# Patient Record
Sex: Female | Born: 1970
Health system: Southern US, Community
[De-identification: ages and names within clinical notes are randomized; demographics above are authoritative.]

## PROBLEM LIST (undated history)

## (undated) DIAGNOSIS — E119 Type 2 diabetes mellitus without complications: Secondary | ICD-10-CM

## (undated) DIAGNOSIS — K219 Gastro-esophageal reflux disease without esophagitis: Secondary | ICD-10-CM

## (undated) DIAGNOSIS — E785 Hyperlipidemia, unspecified: Secondary | ICD-10-CM

## (undated) DIAGNOSIS — E282 Polycystic ovarian syndrome: Secondary | ICD-10-CM

## (undated) DIAGNOSIS — K760 Fatty (change of) liver, not elsewhere classified: Secondary | ICD-10-CM

## (undated) DIAGNOSIS — Z8619 Personal history of other infectious and parasitic diseases: Secondary | ICD-10-CM

## (undated) HISTORY — DX: Type 2 diabetes mellitus without complications: E11.9

## (undated) HISTORY — DX: Hyperlipidemia, unspecified: E78.5

## (undated) HISTORY — DX: Fatty (change of) liver, not elsewhere classified: K76.0

## (undated) HISTORY — DX: Polycystic ovarian syndrome: E28.2

## (undated) HISTORY — DX: Gastro-esophageal reflux disease without esophagitis: K21.9

## (undated) HISTORY — PX: NOVASURE ABLATION: SHX5394

## (undated) HISTORY — DX: Personal history of other infectious and parasitic diseases: Z86.19

---

## 2003-06-16 DIAGNOSIS — N946 Dysmenorrhea, unspecified: Secondary | ICD-10-CM | POA: Insufficient documentation

## 2005-05-09 ENCOUNTER — Emergency Department: Payer: Self-pay | Admitting: Emergency Medicine

## 2005-08-25 DIAGNOSIS — E785 Hyperlipidemia, unspecified: Secondary | ICD-10-CM | POA: Insufficient documentation

## 2005-08-25 DIAGNOSIS — E039 Hypothyroidism, unspecified: Secondary | ICD-10-CM | POA: Insufficient documentation

## 2006-09-17 DIAGNOSIS — F32A Depression, unspecified: Secondary | ICD-10-CM | POA: Insufficient documentation

## 2009-06-21 ENCOUNTER — Emergency Department: Payer: Self-pay | Admitting: Emergency Medicine

## 2010-07-28 HISTORY — PX: OTHER SURGICAL HISTORY: SHX169

## 2010-09-01 ENCOUNTER — Ambulatory Visit: Payer: Self-pay | Admitting: Obstetrics & Gynecology

## 2010-09-04 ENCOUNTER — Ambulatory Visit: Payer: Self-pay | Admitting: Obstetrics & Gynecology

## 2010-09-04 HISTORY — PX: TUBAL LIGATION: SHX77

## 2010-09-08 LAB — PATHOLOGY REPORT

## 2010-12-26 ENCOUNTER — Emergency Department: Payer: Self-pay | Admitting: Emergency Medicine

## 2011-05-26 ENCOUNTER — Ambulatory Visit: Payer: Self-pay | Admitting: Family Medicine

## 2012-07-02 IMAGING — CR DG CHEST 2V
1 series · 2 of 2 positions shown · non-contrast
Comparison: none

REASON FOR EXAM: cough
COMMENTS:

PROCEDURE:     KDR - KDXR CHEST PA (OR AP) AND LAT  - May 26, 2011  [DATE]
RESULT:     The lungs are clear. The cardiac silhouette and visualized bony
skeleton are unremarkable.

[Series 1: pa · 0.17mm/px · 2 of 2 slices shown]
[im 1/2]
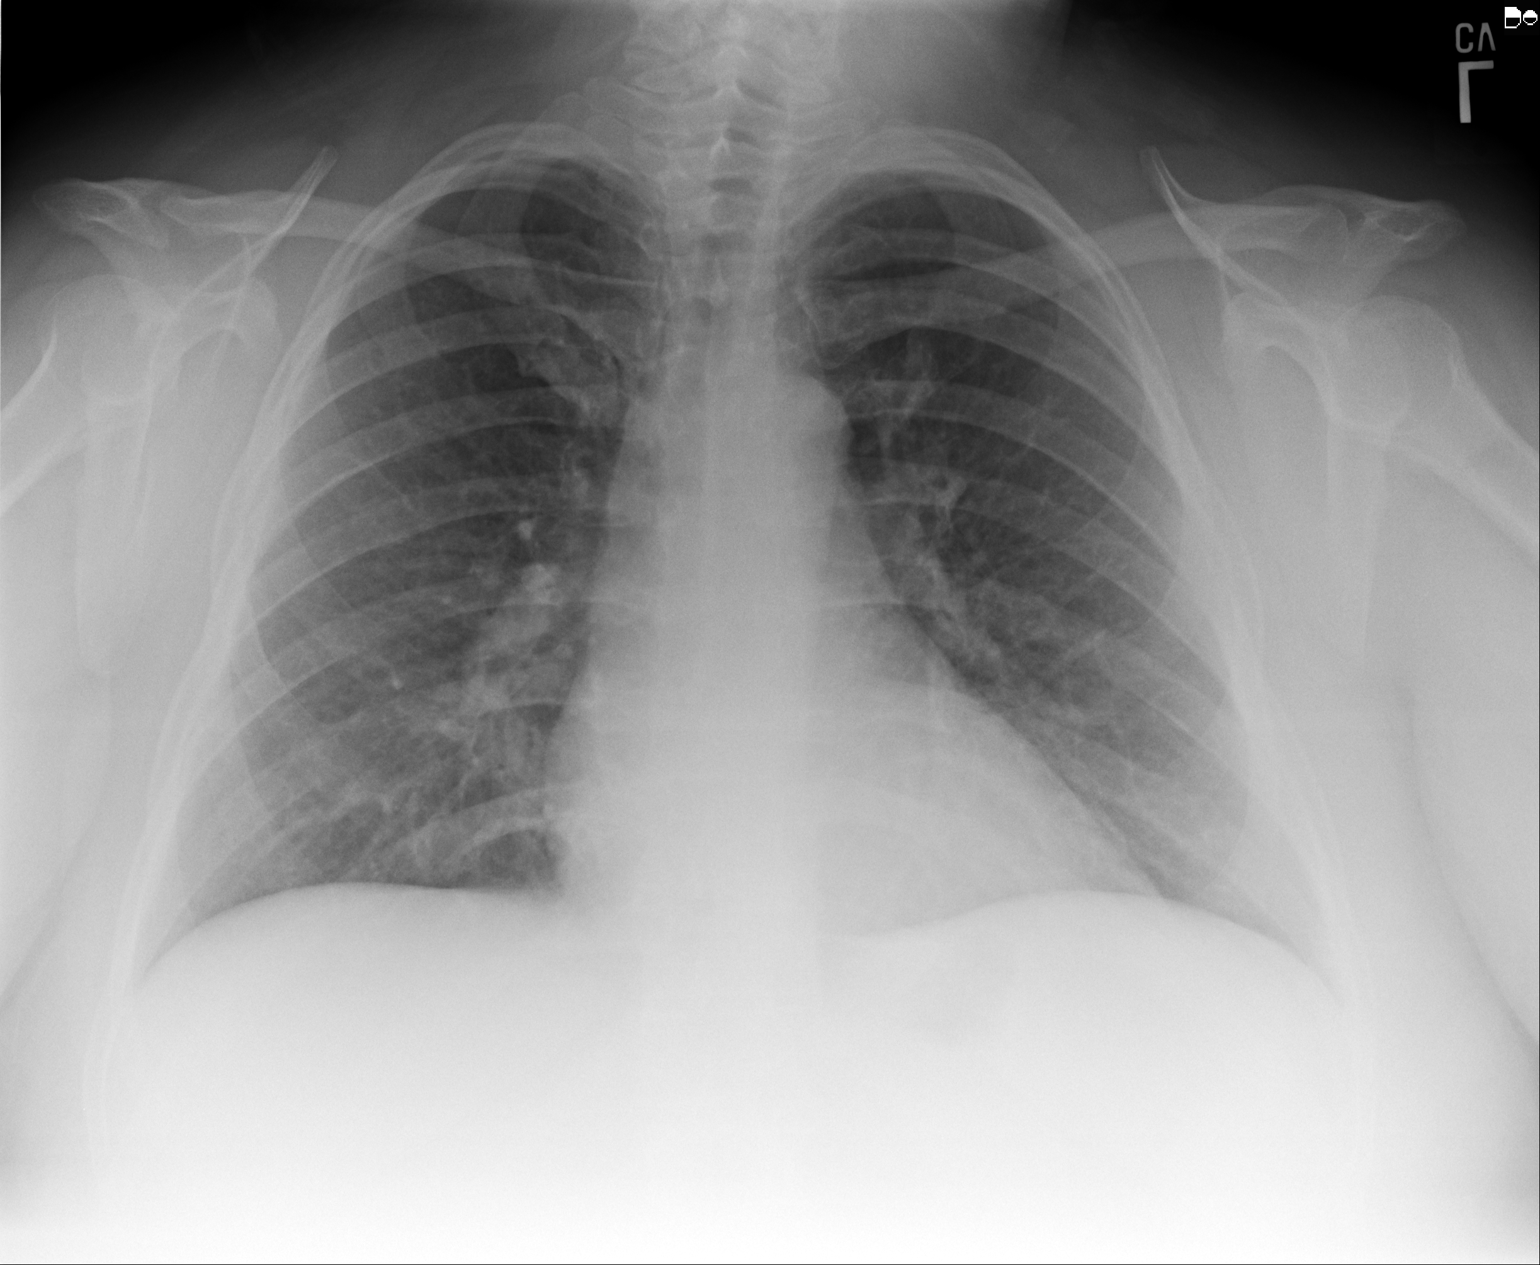
[im 2/2]
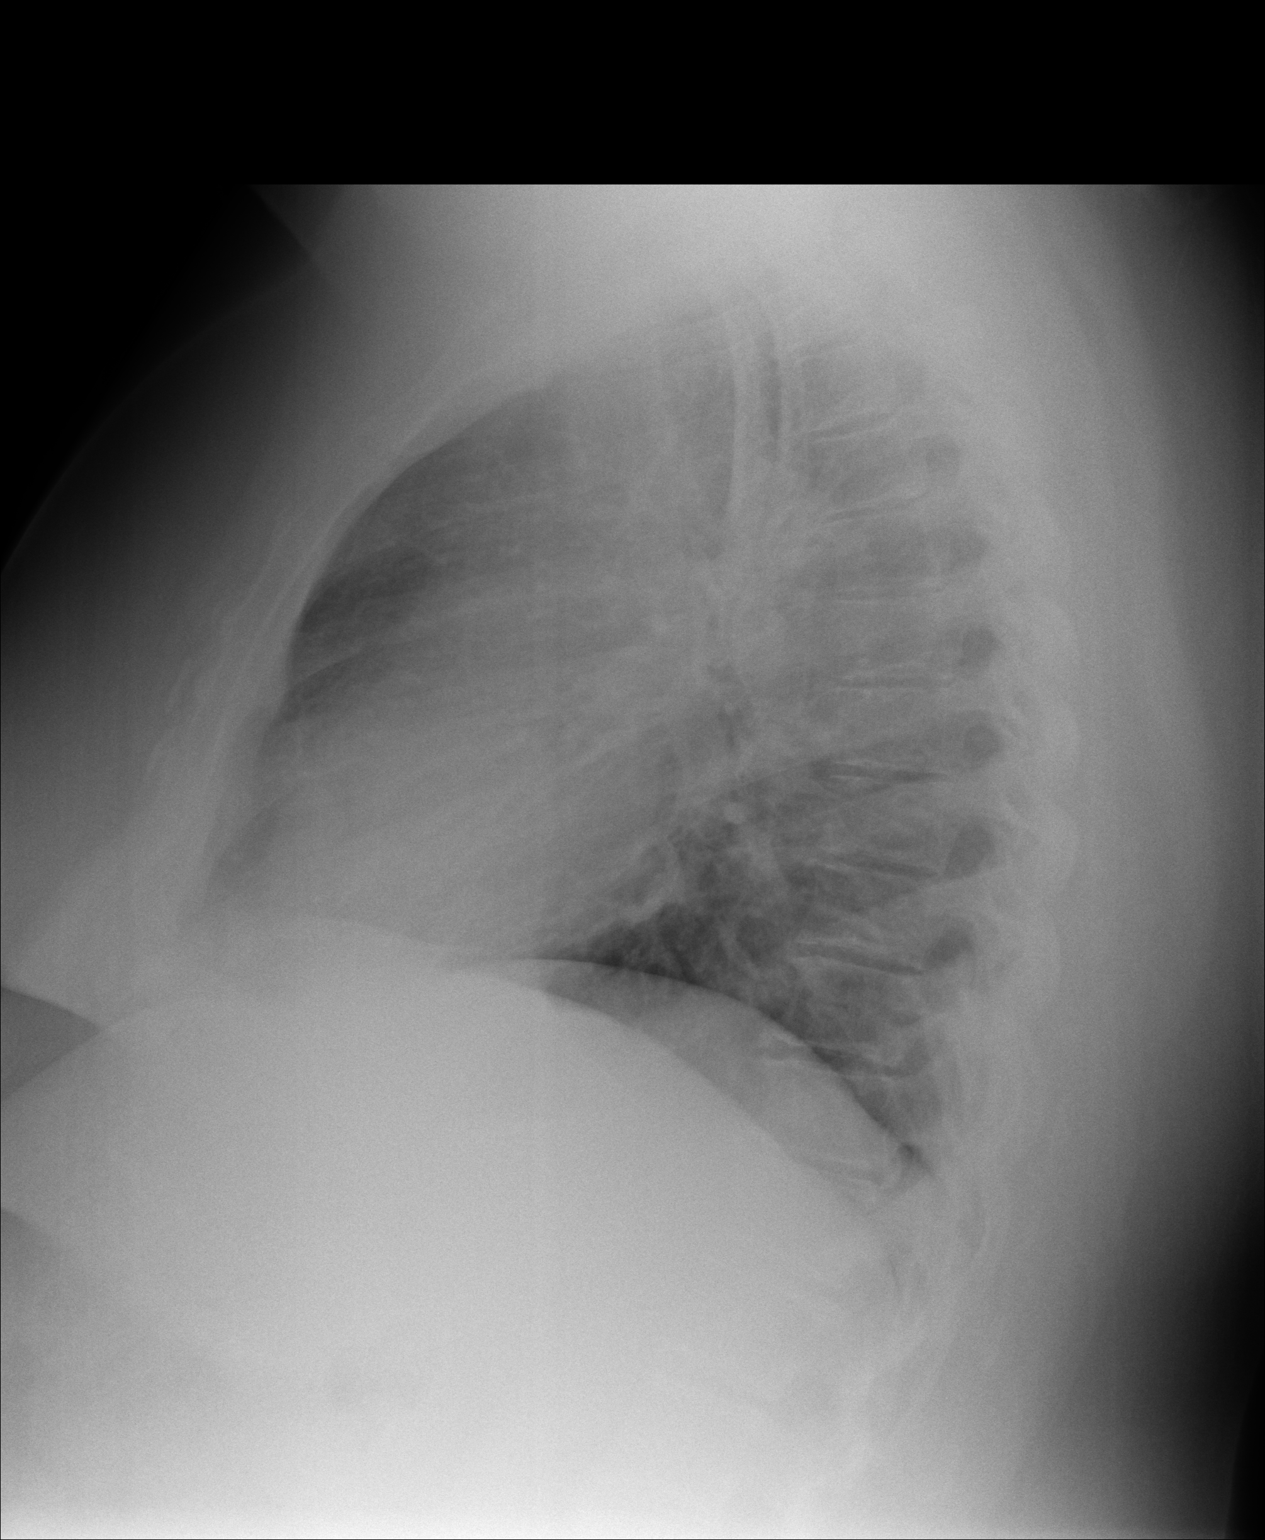

[2 of 2 positions shown; findings below may reference images not displayed]

IMPRESSION: 1. Chest radiograph without evidence of acute cardiopulmonary disease.

## 2013-02-21 ENCOUNTER — Emergency Department: Payer: Self-pay | Admitting: Emergency Medicine

## 2014-01-29 ENCOUNTER — Emergency Department: Payer: Self-pay | Admitting: Emergency Medicine

## 2014-03-31 IMAGING — CR DG CHEST 2V
1 series · 2 of 2 positions shown · non-contrast
Comparison: none

REASON FOR EXAM: sob
COMMENTS:

[Series 1: w chest pa · 0.14mm/px · 2 of 2 slices shown]
[im 1/2]
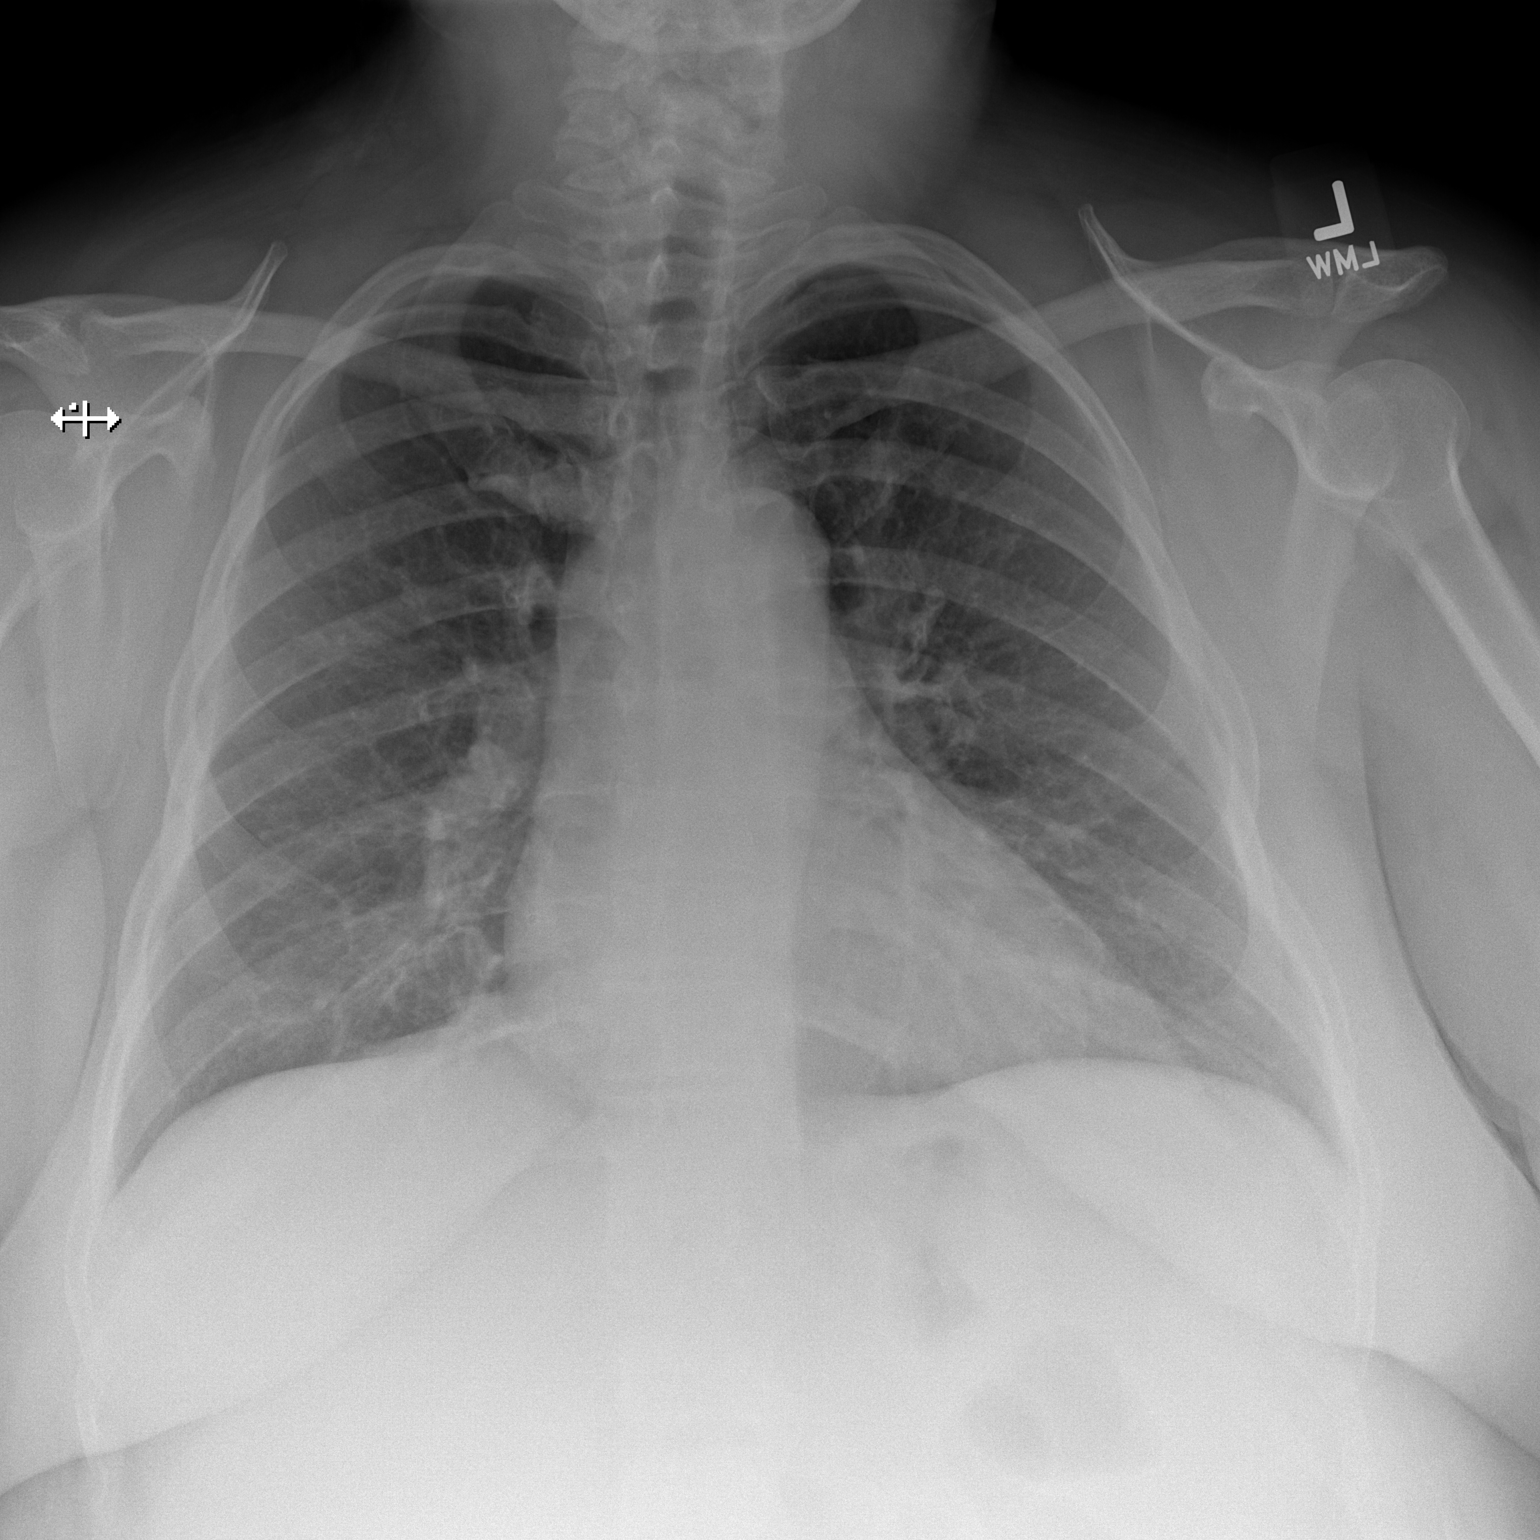
[im 2/2]
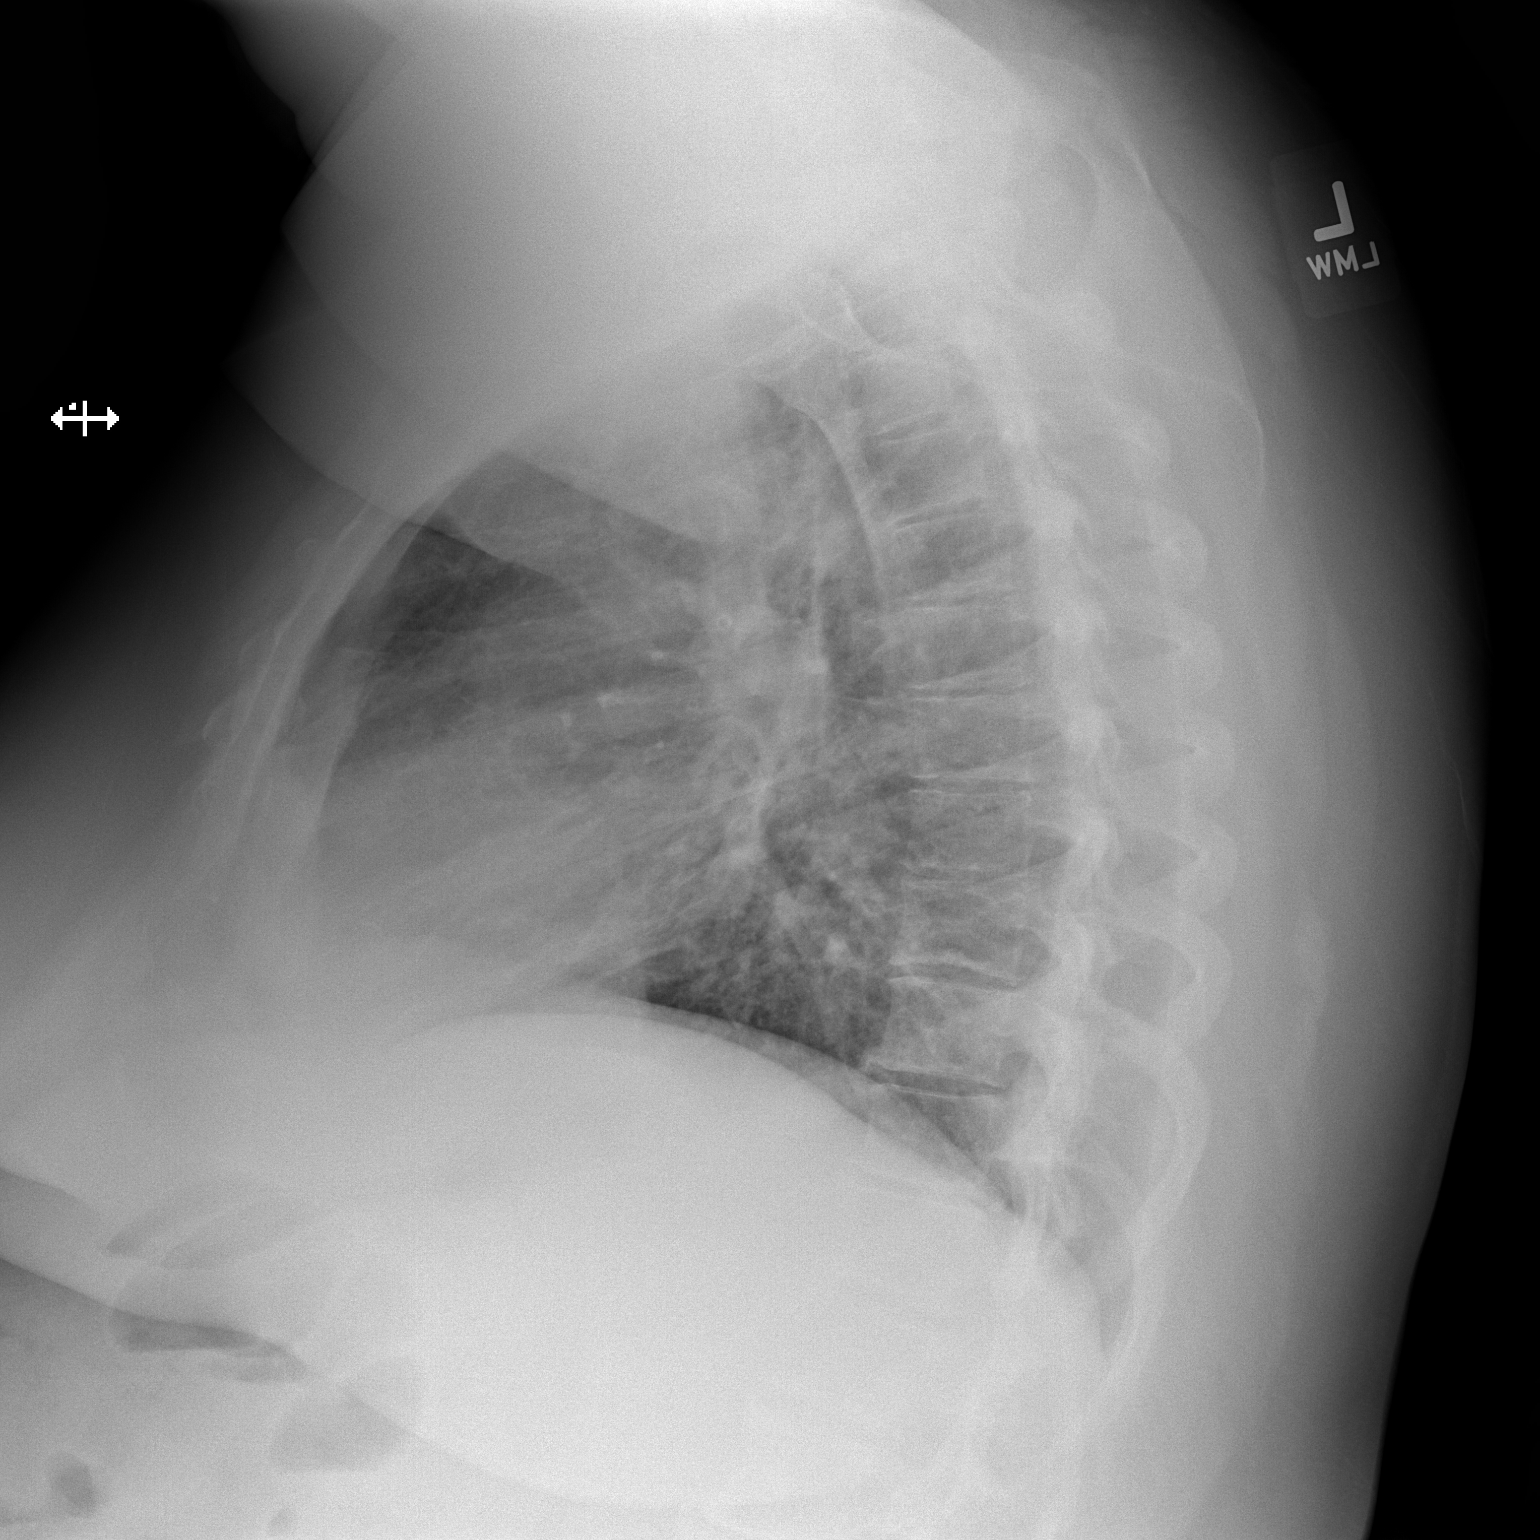

[2 of 2 positions shown; findings below may reference images not displayed]

PROCEDURE:     DXR - DXR CHEST PA (OR AP) AND LATERAL  - February 21, 2013  [DATE]

RESULT:     Comparison is made to the study May 26, 2011.

The lungs are adequately inflated. The perihilar interstitial markings are
increased. The cardiac silhouette is mildly enlarged. The central pulmonary
vascularity is mildly prominent. There is no pleural effusion.
IMPRESSION: The findings suggest interstitial edema of cardiac or
noncardiac cause. I cannot exclude early interstitial pneumonia in the
appropriate clinical setting.

Correlation with the patient's clinical and laboratory values is needed.

[REDACTED]

## 2014-04-25 ENCOUNTER — Emergency Department: Payer: Self-pay | Admitting: Emergency Medicine

## 2014-08-16 LAB — HEPATIC FUNCTION PANEL: ALT: 27 U/L (ref 7–35)

## 2014-08-16 LAB — BASIC METABOLIC PANEL
BUN: 13 mg/dL (ref 4–21)
CREATININE: 0.6 mg/dL (ref 0.5–1.1)
GLUCOSE: 100 mg/dL
POTASSIUM: 4.2 mmol/L (ref 3.4–5.3)
Sodium: 138 mmol/L (ref 137–147)

## 2014-08-16 LAB — LIPID PANEL
Cholesterol: 201 mg/dL — AB (ref 0–200)
HDL: 55 mg/dL (ref 35–70)
LDL Cholesterol: 103 mg/dL
Triglycerides: 216 mg/dL — AB (ref 40–160)

## 2014-08-16 LAB — TSH: TSH: 5.75 u[IU]/mL (ref 0.41–5.90)

## 2014-11-16 ENCOUNTER — Other Ambulatory Visit: Payer: Self-pay | Admitting: Family Medicine

## 2014-11-16 ENCOUNTER — Telehealth: Payer: Self-pay | Admitting: Family Medicine

## 2014-11-16 NOTE — Telephone Encounter (Signed)
Pt is requesting a refill for Hydrocodone.  WG#[phone]

## 2014-11-19 MED ORDER — HYDROCODONE-ACETAMINOPHEN 5-325 MG PO TABS: 1.0000 | ORAL_TABLET | Freq: Four times a day (QID) | ORAL | Status: DC | PRN

## 2014-12-19 ENCOUNTER — Other Ambulatory Visit: Payer: Self-pay | Admitting: Family Medicine

## 2014-12-19 MED ORDER — HYDROCODONE-ACETAMINOPHEN 5-325 MG PO TABS: 1.0000 | ORAL_TABLET | Freq: Four times a day (QID) | ORAL | Status: DC | PRN

## 2014-12-19 NOTE — Telephone Encounter (Signed)
Pt contacted office for refill request on the following medications:  HYDROcodone-acetaminophen (NORCO/VICODIN) 5-325 MG.  CB#[phone]

## 2015-01-20 ENCOUNTER — Other Ambulatory Visit: Payer: Self-pay | Admitting: Family Medicine

## 2015-01-23 ENCOUNTER — Telehealth: Payer: Self-pay | Admitting: Family Medicine

## 2015-01-23 MED ORDER — HYDROCODONE-ACETAMINOPHEN 5-325 MG PO TABS: 1.0000 | ORAL_TABLET | Freq: Four times a day (QID) | ORAL | Status: DC | PRN

## 2015-01-23 NOTE — Telephone Encounter (Signed)
VHYDROcodone-acetaminophen (NORCO/VICODIN) 5-325 MG per tablet 12/19/14 -- Birdie Sons, MD Take 1 tablet by mouth every 6 (six) hours as needed for moderate pain Pt called for refill  Thanks,  Con Memos

## 2015-02-19 ENCOUNTER — Other Ambulatory Visit: Payer: Self-pay | Admitting: Family Medicine

## 2015-02-19 NOTE — Telephone Encounter (Signed)
Pt contacted office for refill request on the following medications: HYDROcodone-acetaminophen (NORCO/VICODIN) 5-325 MG per tablet. Pt stated she isn't out but wanted to request it before she runs out. Thanks TNP

## 2015-02-20 MED ORDER — HYDROCODONE-ACETAMINOPHEN 5-325 MG PO TABS: 1.0000 | ORAL_TABLET | Freq: Four times a day (QID) | ORAL | Status: DC | PRN

## 2015-03-22 ENCOUNTER — Other Ambulatory Visit: Payer: Self-pay | Admitting: Family Medicine

## 2015-03-22 MED ORDER — HYDROCODONE-ACETAMINOPHEN 5-325 MG PO TABS: 1.0000 | ORAL_TABLET | Freq: Four times a day (QID) | ORAL | Status: DC | PRN

## 2015-03-22 NOTE — Telephone Encounter (Signed)
Pt contacted office for refill request on the following medications: HYDROcodone-acetaminophen (NORCO/VICODIN) 5-325 MG per tablet. Thanks TNP

## 2015-04-22 ENCOUNTER — Other Ambulatory Visit: Payer: Self-pay | Admitting: Family Medicine

## 2015-04-22 NOTE — Telephone Encounter (Signed)
Pt needs refill HYDROcodone-acetaminophen (NORCO/VICODIN) 5-325 MG tablet  Call back is [phone]  Thank sTeri

## 2015-04-23 MED ORDER — HYDROCODONE-ACETAMINOPHEN 5-325 MG PO TABS: 1.0000 | ORAL_TABLET | Freq: Four times a day (QID) | ORAL | Status: DC | PRN

## 2015-05-08 ENCOUNTER — Other Ambulatory Visit: Payer: Self-pay | Admitting: Family Medicine

## 2015-05-08 NOTE — Telephone Encounter (Signed)
Please call in zolpidem  

## 2015-05-08 NOTE — Telephone Encounter (Signed)
Rx called in to pharmacy. 

## 2015-05-10 NOTE — Telephone Encounter (Signed)
Rx called in to pharmacy. 

## 2015-05-10 NOTE — Telephone Encounter (Signed)
Please call in zolpidem  

## 2015-05-23 ENCOUNTER — Other Ambulatory Visit: Payer: Self-pay | Admitting: Family Medicine

## 2015-05-23 NOTE — Telephone Encounter (Signed)
Pt contacted office for refill request on the following medications: HYDROcodone-acetaminophen (NORCO/VICODIN) 5-325 MG tablet. Thanks TNP

## 2015-05-24 MED ORDER — HYDROCODONE-ACETAMINOPHEN 5-325 MG PO TABS: 1.0000 | ORAL_TABLET | Freq: Four times a day (QID) | ORAL | Status: DC | PRN

## 2015-06-25 ENCOUNTER — Other Ambulatory Visit: Payer: Self-pay | Admitting: Family Medicine

## 2015-06-25 NOTE — Telephone Encounter (Signed)
Pt contacted office for refill request on the following medications: ° °HYDROcodone-acetaminophen (NORCO/VICODIN) 5-325 MG tablet  ° °CB#[phone]/MW °

## 2015-06-25 NOTE — Telephone Encounter (Signed)
Med was last filled on 05/24/2015. Last OV was 08/14/2014.

## 2015-06-26 MED ORDER — HYDROCODONE-ACETAMINOPHEN 5-325 MG PO TABS: 1.0000 | ORAL_TABLET | Freq: Four times a day (QID) | ORAL | Status: DC | PRN

## 2015-07-26 ENCOUNTER — Other Ambulatory Visit: Payer: Self-pay | Admitting: Family Medicine

## 2015-07-26 MED ORDER — HYDROCODONE-ACETAMINOPHEN 5-325 MG PO TABS: 1.0000 | ORAL_TABLET | Freq: Four times a day (QID) | ORAL | Status: DC | PRN

## 2015-07-26 NOTE — Telephone Encounter (Signed)
Pt contacted office for refill request on the following medications: ° °HYDROcodone-acetaminophen (NORCO/VICODIN) 5-325 MG tablet  ° °CB#[phone]/MW °

## 2015-07-26 NOTE — Telephone Encounter (Signed)
Unable to reach pt. Left note with rx letting pt know that she is due for ov.

## 2015-07-26 NOTE — Telephone Encounter (Signed)
rx is ready, but patient is due for o.v and needs to be seen  Before next refill.

## 2015-07-29 ENCOUNTER — Other Ambulatory Visit: Payer: Self-pay | Admitting: *Deleted

## 2015-07-29 MED ORDER — SERTRALINE HCL 50 MG PO TABS: 50.0000 mg | ORAL_TABLET | Freq: Every day | ORAL | Status: DC

## 2015-08-27 ENCOUNTER — Other Ambulatory Visit: Payer: Self-pay | Admitting: Family Medicine

## 2015-08-27 ENCOUNTER — Other Ambulatory Visit: Payer: Self-pay | Admitting: *Deleted

## 2015-08-27 MED ORDER — METFORMIN HCL 1000 MG PO TABS: ORAL_TABLET | ORAL | Status: DC

## 2015-08-27 NOTE — Telephone Encounter (Signed)
Patient has ov appointment scheduled for 08/30/15.

## 2015-08-28 DIAGNOSIS — L301 Dyshidrosis [pompholyx]: Secondary | ICD-10-CM | POA: Insufficient documentation

## 2015-08-28 DIAGNOSIS — I781 Nevus, non-neoplastic: Secondary | ICD-10-CM

## 2015-08-28 DIAGNOSIS — I10 Essential (primary) hypertension: Secondary | ICD-10-CM | POA: Insufficient documentation

## 2015-08-28 DIAGNOSIS — J309 Allergic rhinitis, unspecified: Secondary | ICD-10-CM | POA: Insufficient documentation

## 2015-08-28 DIAGNOSIS — E282 Polycystic ovarian syndrome: Secondary | ICD-10-CM | POA: Insufficient documentation

## 2015-08-28 DIAGNOSIS — G4700 Insomnia, unspecified: Secondary | ICD-10-CM | POA: Insufficient documentation

## 2015-08-28 DIAGNOSIS — D1801 Hemangioma of skin and subcutaneous tissue: Secondary | ICD-10-CM | POA: Insufficient documentation

## 2015-08-28 DIAGNOSIS — B001 Herpesviral vesicular dermatitis: Secondary | ICD-10-CM | POA: Insufficient documentation

## 2015-08-30 ENCOUNTER — Ambulatory Visit (INDEPENDENT_AMBULATORY_CARE_PROVIDER_SITE_OTHER): Payer: 59 | Admitting: Family Medicine

## 2015-08-30 ENCOUNTER — Encounter: Payer: Self-pay | Admitting: Family Medicine

## 2015-08-30 VITALS — HR 84 | Temp 98.1°F | Resp 16 | Ht 63.0 in | Wt 337.0 lb

## 2015-08-30 DIAGNOSIS — R030 Elevated blood-pressure reading, without diagnosis of hypertension: Secondary | ICD-10-CM | POA: Diagnosis not present

## 2015-08-30 DIAGNOSIS — E039 Hypothyroidism, unspecified: Secondary | ICD-10-CM

## 2015-08-30 DIAGNOSIS — G4700 Insomnia, unspecified: Secondary | ICD-10-CM | POA: Diagnosis not present

## 2015-08-30 DIAGNOSIS — I10 Essential (primary) hypertension: Secondary | ICD-10-CM

## 2015-08-30 DIAGNOSIS — IMO0001 Reserved for inherently not codable concepts without codable children: Secondary | ICD-10-CM

## 2015-08-30 DIAGNOSIS — R739 Hyperglycemia, unspecified: Secondary | ICD-10-CM

## 2015-08-30 DIAGNOSIS — Z720 Tobacco use: Secondary | ICD-10-CM

## 2015-08-30 DIAGNOSIS — E119 Type 2 diabetes mellitus without complications: Secondary | ICD-10-CM | POA: Insufficient documentation

## 2015-08-30 DIAGNOSIS — N946 Dysmenorrhea, unspecified: Secondary | ICD-10-CM | POA: Diagnosis not present

## 2015-08-30 DIAGNOSIS — E785 Hyperlipidemia, unspecified: Secondary | ICD-10-CM | POA: Diagnosis not present

## 2015-08-30 MED ORDER — HYDROCODONE-ACETAMINOPHEN 5-325 MG PO TABS: 1.0000 | ORAL_TABLET | Freq: Four times a day (QID) | ORAL | Status: DC | PRN

## 2015-08-30 MED ORDER — BUPROPION HCL ER (SR) 150 MG PO TB12: ORAL_TABLET | ORAL | Status: DC

## 2015-08-30 NOTE — Progress Notes (Signed)
Patient: Karen Estrada Female    DOB: [birth year]   45 y.o.   MRN: [id number] Visit Date: 08/30/2015  Today's Provider: Lelon Huh, MD   No chief complaint on file.  Subjective:    Hyperlipidemia This is a chronic problem. The problem is controlled. Recent lipid tests were reviewed and are normal. Pertinent negatives include no myalgias or shortness of breath. There are no compliance problems.  Risk factors for coronary artery disease include dyslipidemia and obesity.  Thyroid Problem Presents for follow-up visit. Patient reports no anxiety, cold intolerance, constipation, diaphoresis, diarrhea, fatigue, heat intolerance, tremors, weight gain or weight loss. The symptoms have been stable. Past treatments include levothyroxine. The treatment provided significant relief. Her past medical history is significant for hyperlipidemia.  Hypertension Pertinent negatives include no headaches or shortness of breath. Hypertensive end-organ damage includes a thyroid problem.  Depression        This is a chronic problem.The problem is unchanged (Doing well on current medications).  Associated symptoms include no decreased concentration, no fatigue, does not have insomnia, no decreased interest, no appetite change, no myalgias, no headaches, not sad and no suicidal ideas.  Compliance with treatment is good.  Previous treatment provided significant relief.  Past medical history includes thyroid problem.    Lab Results  Component Value Date   CHOL 201* 08/16/2014   HDL 55 08/16/2014   LDLCALC 103 08/16/2014   TRIG 216* 08/16/2014        Allergies  Allergen Reactions  . Fluticasone     Caused nosebleeds  . Lovastatin     Elevated CK (1012)   Previous Medications   FLUTICASONE (FLONASE) 50 MCG/ACT NASAL SPRAY    Place 2 sprays into both nostrils daily.   HYDROCODONE-ACETAMINOPHEN (NORCO/VICODIN) 5-325 MG TABLET    Take 1 tablet by mouth every 6 (six) hours as needed for moderate  pain.   LEVOTHYROXINE (SYNTHROID, LEVOTHROID) 112 MCG TABLET    TAKE ONE TABLET BY MOUTH ONCE DAILY   METFORMIN (GLUCOPHAGE) 1000 MG TABLET    1/2 tablet in the morning, one full tablet in the evening   MONTELUKAST (SINGULAIR) 10 MG TABLET    Take 1 tablet by mouth daily.   NAPROXEN (NAPROSYN) 500 MG TABLET    TAKE ONE TABLET BY MOUTH TWICE DAILY AS NEEDED   PRAVASTATIN (PRAVACHOL) 40 MG TABLET    Take 1 tablet by mouth daily.   PROMETHAZINE (PHENERGAN) 25 MG TABLET    Take 1 tablet by mouth every 6 (six) hours as needed.   SERTRALINE (ZOLOFT) 50 MG TABLET    Take 1 tablet (50 mg total) by mouth daily.   TRIAMCINOLONE CREAM (KENALOG) 0.5 %    APPLY CREAM TOPICALLY TO AFFECTED AREA TWICE DAILY, AS DIRECTED   ZOLPIDEM (AMBIEN) 10 MG TABLET    TAKE ONE TABLET BY MOUTH AT BEDTIME    Review of Systems  Constitutional: Negative for fever, chills, weight loss, weight gain, diaphoresis, activity change, appetite change, fatigue and unexpected weight change.  Respiratory: Positive for cough and wheezing. Negative for apnea, choking, chest tightness, shortness of breath and stridor.        Secondary to her sinuses   Cardiovascular: Negative.   Gastrointestinal: Negative for nausea, vomiting, abdominal pain, diarrhea, constipation, blood in stool, abdominal distention, anal bleeding and rectal pain.  Endocrine: Negative for cold intolerance, heat intolerance, polydipsia, polyphagia and polyuria.  Musculoskeletal: Negative for myalgias.  Neurological: Negative for dizziness, tremors,  weakness, light-headedness, numbness and headaches.  Psychiatric/Behavioral: Positive for depression. Negative for suicidal ideas, hallucinations, behavioral problems, confusion, sleep disturbance, self-injury, dysphoric mood, decreased concentration and agitation. The patient is not nervous/anxious, does not have insomnia and is not hyperactive.        Pt reports under good control with current medications.      Social  History  Substance Use Topics  . Smoking status: Current Every Day Smoker -- 0.75 packs/day for 9 years    Types: Cigarettes  . Smokeless tobacco: Not on file  . Alcohol Use: No   Objective:   Pulse 84  Temp(Src) 98.1 F (36.7 C) (Oral)  Resp 16  Ht 5\' 3"  (1.6 m)  Wt 337 lb (152.862 kg)  BMI 59.71 kg/m2  Physical Exam   General Appearance:    Alert, cooperative, no distress, morbidly obese  Eyes:    PERRL, conjunctiva/corneas clear, EOM's intact       Lungs:     Clear to auscultation bilaterally, respirations unlabored  Heart:    Regular rate and rhythm  Neurologic:   Awake, alert, oriented x 3. No apparent focal neurological           defect.           Assessment & Plan:     1. Hypothyroidism, unspecified hypothyroidism type  - TSH  2. Hyperlipidemia She is tolerating pravastatin well with no adverse effects.   - Lipid panel  3. Insomnia Doing well with Ambien which she take 2-3 times a week.   4. Elevated blood pressure  - Basic metabolic panel  5. Tobacco abuse  - buPROPion (WELLBUTRIN SR) 150 MG 12 hr tablet; 1 tablet daily for 3 days, then 1 tablet twice daily. Stop smoking 14 days after starting medication  Dispense: 60 tablet; Refill: 3  6. Hyperglycemia  - Hemoglobin A1c  7. Dysmenorrhea Continue prn Vicodin - HYDROcodone-acetaminophen (NORCO/VICODIN) 5-325 MG tablet; Take 1 tablet by mouth every 6 (six) hours as needed for moderate pain.  Dispense: 30 tablet; Refill: 0  8. Morbid obesity, unspecified obesity type ([place])         Lelon Huh, MD  [facility]

## 2015-08-31 LAB — BASIC METABOLIC PANEL
BUN / CREAT RATIO: 14 (ref 9–23)
BUN: 8 mg/dL (ref 6–24)
CHLORIDE: 97 mmol/L (ref 96–106)
CO2: 27 mmol/L (ref 18–29)
Calcium: 9.2 mg/dL (ref 8.7–10.2)
Creatinine, Ser: 0.58 mg/dL (ref 0.57–1.00)
GFR, EST AFRICAN AMERICAN: 129 mL/min/{1.73_m2} (ref >59)
GFR, EST NON AFRICAN AMERICAN: 112 mL/min/{1.73_m2} (ref >59)
Glucose: 112 mg/dL — ABNORMAL HIGH (ref 65–99)
POTASSIUM: 3.9 mmol/L (ref 3.5–5.2)
Sodium: 139 mmol/L (ref 134–144)

## 2015-08-31 LAB — TSH: TSH: 11.81 u[IU]/mL — ABNORMAL HIGH (ref 0.450–4.500)

## 2015-08-31 LAB — LIPID PANEL
Chol/HDL Ratio: 5.6 ratio units — ABNORMAL HIGH (ref 0.0–4.4)
Cholesterol, Total: 209 mg/dL — ABNORMAL HIGH (ref 100–199)
HDL: 37 mg/dL — AB (ref >39)
LDL Calculated: 125 mg/dL — ABNORMAL HIGH (ref 0–99)
TRIGLYCERIDES: 237 mg/dL — AB (ref 0–149)
VLDL Cholesterol Cal: 47 mg/dL — ABNORMAL HIGH (ref 5–40)

## 2015-08-31 LAB — HEMOGLOBIN A1C
Est. average glucose Bld gHb Est-mCnc: 131 mg/dL
Hgb A1c MFr Bld: 6.2 % — ABNORMAL HIGH (ref 4.8–5.6)

## 2015-09-02 ENCOUNTER — Telehealth: Payer: Self-pay | Admitting: Family Medicine

## 2015-09-02 MED ORDER — PRAVASTATIN SODIUM 80 MG PO TABS: 80.0000 mg | ORAL_TABLET | Freq: Every day | ORAL | Status: DC

## 2015-09-02 MED ORDER — LEVOTHYROXINE SODIUM 125 MCG PO TABS: 125.0000 ug | ORAL_TABLET | Freq: Every day | ORAL | Status: DC

## 2015-09-02 NOTE — Telephone Encounter (Signed)
-----   Message from Birdie Sons, MD sent at 08/31/2015 10:08 AM EDT ----- Need to increase levothyroxine to 158mcg daily, #30, rf x 3. Need to increase pravastatin to 80mg  daily #30, rf x 3 to get cholesterol under 180. Sugar is averaging 131 which is getting near diabetic range. Need to avoid sweets and starchy foods. Follow up re: thyroid and cholesterol in 3 months.

## 2015-09-02 NOTE — Telephone Encounter (Signed)
Patient was notified of results. Patient expressed understanding. Rx's were sent to pharmacy.

## 2015-09-02 NOTE — Telephone Encounter (Signed)
Pt stated she is returning a call from the nurse regarding lab results.

## 2015-09-03 ENCOUNTER — Telehealth: Payer: Self-pay | Admitting: *Deleted

## 2015-09-05 NOTE — Telephone Encounter (Signed)
done

## 2015-09-25 ENCOUNTER — Encounter: Payer: Self-pay | Admitting: Family Medicine

## 2015-10-04 ENCOUNTER — Other Ambulatory Visit: Payer: Self-pay | Admitting: Family Medicine

## 2015-10-04 ENCOUNTER — Other Ambulatory Visit: Payer: Self-pay

## 2015-10-04 DIAGNOSIS — N946 Dysmenorrhea, unspecified: Secondary | ICD-10-CM

## 2015-10-04 NOTE — Telephone Encounter (Signed)
Patient called requesting refill on Norco and it can wait till Monday she said-aa

## 2015-10-07 MED ORDER — HYDROCODONE-ACETAMINOPHEN 5-325 MG PO TABS
1.0000 | ORAL_TABLET | Freq: Four times a day (QID) | ORAL | Status: DC | PRN
Start: 2015-10-07 — End: 2015-11-15

## 2015-11-15 ENCOUNTER — Other Ambulatory Visit: Payer: Self-pay | Admitting: Family Medicine

## 2015-11-15 DIAGNOSIS — N946 Dysmenorrhea, unspecified: Secondary | ICD-10-CM

## 2015-11-15 MED ORDER — HYDROCODONE-ACETAMINOPHEN 5-325 MG PO TABS: 1.0000 | ORAL_TABLET | Freq: Four times a day (QID) | ORAL | Status: DC | PRN

## 2015-11-15 NOTE — Telephone Encounter (Signed)
Pt contacted office for refill request on the following medications: ° °HYDROcodone-acetaminophen (NORCO/VICODIN) 5-325 MG tablet  ° °CB#[phone]/MW °

## 2015-11-18 ENCOUNTER — Other Ambulatory Visit: Payer: Self-pay | Admitting: *Deleted

## 2015-11-18 MED ORDER — NAPROXEN 500 MG PO TABS: 500.0000 mg | ORAL_TABLET | Freq: Two times a day (BID) | ORAL | Status: DC | PRN

## 2015-11-18 MED ORDER — ONDANSETRON HCL 4 MG PO TABS: 4.0000 mg | ORAL_TABLET | Freq: Three times a day (TID) | ORAL | Status: DC | PRN

## 2015-11-18 MED ORDER — PROMETHAZINE HCL 25 MG PO TABS: 25.0000 mg | ORAL_TABLET | Freq: Four times a day (QID) | ORAL | Status: DC | PRN

## 2015-12-26 ENCOUNTER — Other Ambulatory Visit: Payer: Self-pay | Admitting: Family Medicine

## 2015-12-26 DIAGNOSIS — N946 Dysmenorrhea, unspecified: Secondary | ICD-10-CM

## 2015-12-26 NOTE — Telephone Encounter (Signed)
Pt contacted office for refill request on the following medications: ° °HYDROcodone-acetaminophen (NORCO/VICODIN) 5-325 MG tablet  ° °CB#[phone]/MW °

## 2015-12-26 NOTE — Telephone Encounter (Signed)
Please review. Thanks!  

## 2015-12-27 MED ORDER — HYDROCODONE-ACETAMINOPHEN 5-325 MG PO TABS: 1.0000 | ORAL_TABLET | Freq: Four times a day (QID) | ORAL | Status: DC | PRN

## 2016-01-10 ENCOUNTER — Other Ambulatory Visit: Payer: Self-pay | Admitting: Family Medicine

## 2016-02-18 ENCOUNTER — Other Ambulatory Visit: Payer: Self-pay | Admitting: Family Medicine

## 2016-02-18 DIAGNOSIS — N946 Dysmenorrhea, unspecified: Secondary | ICD-10-CM

## 2016-02-18 NOTE — Telephone Encounter (Signed)
Pt contacted office for refill request on the following medications: HYDROcodone-acetaminophen (NORCO/VICODIN) 5-325 MG tablet  Last written: 12/27/15 Last OV: 08/30/15 Next F/U: 03/02/16 Please advise. Thanks TNP

## 2016-02-19 MED ORDER — HYDROCODONE-ACETAMINOPHEN 5-325 MG PO TABS: 1.0000 | ORAL_TABLET | Freq: Four times a day (QID) | ORAL | 0 refills | Status: DC | PRN

## 2016-03-02 ENCOUNTER — Ambulatory Visit (INDEPENDENT_AMBULATORY_CARE_PROVIDER_SITE_OTHER): Payer: 59 | Admitting: Family Medicine

## 2016-03-02 ENCOUNTER — Encounter: Payer: Self-pay | Admitting: Family Medicine

## 2016-03-02 VITALS — BP 138/98 | HR 96 | Temp 98.7°F | Resp 16 | Wt 342.0 lb

## 2016-03-02 DIAGNOSIS — E785 Hyperlipidemia, unspecified: Secondary | ICD-10-CM

## 2016-03-02 DIAGNOSIS — R7303 Prediabetes: Secondary | ICD-10-CM

## 2016-03-02 DIAGNOSIS — Z23 Encounter for immunization: Secondary | ICD-10-CM

## 2016-03-02 DIAGNOSIS — E039 Hypothyroidism, unspecified: Secondary | ICD-10-CM

## 2016-03-02 LAB — POCT GLYCOSYLATED HEMOGLOBIN (HGB A1C)
ESTIMATED AVERAGE GLUCOSE: 140
HEMOGLOBIN A1C: 6.5

## 2016-03-02 MED ORDER — PRAVASTATIN SODIUM 40 MG PO TABS: 40.0000 mg | ORAL_TABLET | Freq: Every day | ORAL | 6 refills | Status: DC

## 2016-03-02 NOTE — Patient Instructions (Signed)
Start taking Coenzyme Q10 100mg  every day along with pravastatin

## 2016-03-02 NOTE — Progress Notes (Signed)
Patient: Karen Estrada Female    DOB: 09-26-1970   45 y.o.   MRN: [id number] Visit Date: 03/02/2016  Today's Provider: Lelon Huh, MD   Chief Complaint  Patient presents with  . Hypothyroidism    follow up  . Hyperlipidemia    follow up  . Hyperglycemia    follow up   Subjective:    HPI Follow up Hypothyroidism:  Patient was last seen 6 months ago. Changes made duiring that visit includes increasing Levothyroxine to 178mcg daily.  Patient reports good compliance with treatment and good tolerance.   Lipid/Cholesterol, Follow-up:   Last seen for this 6 months ago.  Management changes since that visit include increasing Pravastatin to 80mg  daily. . Last Lipid Panel:    Component Value Date/Time   CHOL 209 (H) 08/30/2015 1644   TRIG 237 (H) 08/30/2015 1644   HDL 37 (L) 08/30/2015 1644   CHOLHDL 5.6 (H) 08/30/2015 1644   LDLCALC 125 (H) 08/30/2015 1644    Risk factors for vascular disease include hypercholesterolemia  She reports poor compliance with treatment. Patient stopped taking Pravastatin 1 month ago due to it causing muscle aches keeping her awake.  She did not have these pains when she was taking 40mg  pravastatin.  She is having side effects.  Current symptoms include none and have been stable. Weight trend: increasing steadily Prior visit with dietician: no Current diet: well balanced Current exercise: none  Wt Readings from Last 3 Encounters:  08/30/15 (!) 337 lb (152.9 kg)  08/14/14 (!) 317 lb (143.8 kg)    -------------------------------------------------------------------  Hyperglycemia, Follow-up:   Lab Results  Component Value Date   HGBA1C 6.2 (H) 08/30/2015   GLUCOSE 112 (H) 08/30/2015    Last seen for for this 6 months ago.  Management since then includes counseling patient to avoid sweets and starchy foods. Current symptoms include none and have been stable.  Weight trend: increasing steadily Prior visit with dietician:  no Current diet: well balanced Current exercise: none  Pertinent Labs:    Component Value Date/Time   CHOL 209 (H) 08/30/2015 1644   TRIG 237 (H) 08/30/2015 1644   CHOLHDL 5.6 (H) 08/30/2015 1644   CREATININE 0.58 08/30/2015 1644    Wt Readings from Last 3 Encounters:  08/30/15 (!) 337 lb (152.9 kg)  08/14/14 (!) 317 lb (143.8 kg)       Allergies  Allergen Reactions  . Fluticasone     Caused nosebleeds  . Lovastatin     Elevated CK (1012)     Current Outpatient Prescriptions:  .  buPROPion (WELLBUTRIN SR) 150 MG 12 hr tablet, 1 tablet daily for 3 days, then 1 tablet twice daily. Stop smoking 14 days after starting medication, Disp: 60 tablet, Rfl: 3 .  fluticasone (FLONASE) 50 MCG/ACT nasal spray, Place 2 sprays into both nostrils daily., Disp: , Rfl:  .  HYDROcodone-acetaminophen (NORCO/VICODIN) 5-325 MG tablet, Take 1 tablet by mouth every 6 (six) hours as needed for moderate pain., Disp: 30 tablet, Rfl: 0 .  levothyroxine (SYNTHROID, LEVOTHROID) 125 MCG tablet, Take 1 tablet (125 mcg total) by mouth daily before breakfast. PATIENT NEEDS TO SCHEDULE OFFICE VISIT FOR FOLLOW UP, Disp: 30 tablet, Rfl: 0 .  metFORMIN (GLUCOPHAGE) 1000 MG tablet, TAKE ONE-HALF TABLET BY MOUTH IN THE MORNING AND ONE WHOLE TABLET IN THE EVENING, Disp: 45 tablet, Rfl: 0 .  montelukast (SINGULAIR) 10 MG tablet, Take 1 tablet by mouth daily., Disp: , Rfl:  .  naproxen (NAPROSYN) 500 MG tablet, Take 1 tablet (500 mg total) by mouth 2 (two) times daily as needed., Disp: 30 tablet, Rfl: 3 .  ondansetron (ZOFRAN) 4 MG tablet, Take 1 tablet (4 mg total) by mouth every 8 (eight) hours as needed. for nausea, Disp: 10 tablet, Rfl: 3 .  pravastatin (PRAVACHOL) 80 MG tablet, TAKE ONE TABLET BY MOUTH ONCE DAILY, Disp: 30 tablet, Rfl: 0 .  promethazine (PHENERGAN) 25 MG tablet, Take 1 tablet (25 mg total) by mouth every 6 (six) hours as needed., Disp: 30 tablet, Rfl: 3 .  sertraline (ZOLOFT) 50 MG tablet, Take 1  tablet (50 mg total) by mouth daily., Disp: 30 tablet, Rfl: 12 .  triamcinolone cream (KENALOG) 0.5 %, APPLY CREAM TOPICALLY TO AFFECTED AREA TWICE DAILY, AS DIRECTED, Disp: 30 g, Rfl: 5 .  zolpidem (AMBIEN) 10 MG tablet, TAKE ONE TABLET BY MOUTH AT BEDTIME, Disp: 30 tablet, Rfl: 0  Review of Systems  Constitutional: Negative for appetite change, chills, fatigue and fever.  Respiratory: Negative for chest tightness and shortness of breath.   Cardiovascular: Negative for chest pain and palpitations.  Gastrointestinal: Negative for abdominal pain, nausea and vomiting.  Neurological: Negative for dizziness and weakness.    Social History  Substance Use Topics  . Smoking status: Current Every Day Smoker    Packs/day: 0.75    Years: 9.00    Types: Cigarettes  . Smokeless tobacco: Never Used  . Alcohol use No   Objective:   BP (!) 138/98 (BP Location: Left Wrist, Patient Position: Sitting, Cuff Size: Large)   Pulse 96   Temp 98.7 F (37.1 C) (Oral)   Resp 16   Wt (!) 342 lb (155.1 kg)   BMI 60.58 kg/m   Physical Exam  General Appearance:    Alert, cooperative, no distress, obese  Eyes:    PERRL, conjunctiva/corneas clear, EOM's intact       Lungs:     Clear to auscultation bilaterally, respirations unlabored  Heart:    Regular rate and rhythm  Neurologic:   Awake, alert, oriented x 3. No apparent focal neurological           defect.       Results for orders placed or performed in visit on 03/02/16  POCT HgB A1C  Result Value Ref Range   Hemoglobin A1C 6.5    Est. average glucose Bld gHb Est-mCnc 140         Assessment & Plan:     1. Hypothyroidism, unspecified hypothyroidism type Tolerating increased dose of levothyroxine - TSH  2. Pre-diabetes Continue current medications. Counseled to reduce white starchy food and incresae exercise.  - POCT HgB A1C  3. Hyperlipidemia She did tolerate lower dose of pravastatin (40mg ) will restart at 40mg  along with 100mg  CoQ10.     4. Need for influenza vaccination  - Flu Vaccine QUAD 36+ mos IM     The entirety of the information documented in the History of Present Illness, Review of Systems and Physical Exam were personally obtained by me. Portions of this information were initially documented by Meyer Cory, CMA and reviewed by me for thoroughness and accuracy.    Lelon Huh, MD  [facility]

## 2016-03-27 ENCOUNTER — Other Ambulatory Visit: Payer: Self-pay | Admitting: Family Medicine

## 2016-03-28 LAB — TSH: TSH: 6.04 u[IU]/mL — AB (ref 0.450–4.500)

## 2016-03-30 ENCOUNTER — Other Ambulatory Visit: Payer: Self-pay

## 2016-03-30 ENCOUNTER — Telehealth: Payer: Self-pay | Admitting: Family Medicine

## 2016-03-30 DIAGNOSIS — N946 Dysmenorrhea, unspecified: Secondary | ICD-10-CM

## 2016-03-30 NOTE — Telephone Encounter (Signed)
LMOVM for pt to return call 

## 2016-03-30 NOTE — Telephone Encounter (Signed)
Patient called requesting refill on medication. Thanks

## 2016-03-30 NOTE — Telephone Encounter (Signed)
Pt returned call regarding her test results.  Thank sTeri

## 2016-03-31 NOTE — Telephone Encounter (Signed)
Advised patient of results, by [facility].

## 2016-04-01 MED ORDER — HYDROCODONE-ACETAMINOPHEN 5-325 MG PO TABS: 1.0000 | ORAL_TABLET | Freq: Four times a day (QID) | ORAL | 0 refills | Status: DC | PRN

## 2016-04-10 ENCOUNTER — Telehealth: Payer: Self-pay | Admitting: Family Medicine

## 2016-04-10 NOTE — Telephone Encounter (Signed)
Pt is being discharged from [facility] 04/11/2016 for Hypoxemia.  I have requested records faxed.  I have scheduled a hospital follow up appt/MW

## 2016-04-20 ENCOUNTER — Ambulatory Visit (INDEPENDENT_AMBULATORY_CARE_PROVIDER_SITE_OTHER): Payer: 59 | Admitting: Family Medicine

## 2016-04-20 ENCOUNTER — Encounter: Payer: Self-pay | Admitting: Family Medicine

## 2016-04-20 VITALS — BP 122/90 | HR 90 | Temp 98.8°F | Resp 20 | Wt 338.0 lb

## 2016-04-20 DIAGNOSIS — J189 Pneumonia, unspecified organism: Secondary | ICD-10-CM | POA: Diagnosis not present

## 2016-04-20 DIAGNOSIS — E785 Hyperlipidemia, unspecified: Secondary | ICD-10-CM

## 2016-04-20 DIAGNOSIS — R252 Cramp and spasm: Secondary | ICD-10-CM | POA: Diagnosis not present

## 2016-04-20 DIAGNOSIS — Z720 Tobacco use: Secondary | ICD-10-CM | POA: Diagnosis not present

## 2016-04-20 MED ORDER — ZOLPIDEM TARTRATE 10 MG PO TABS: 10.0000 mg | ORAL_TABLET | Freq: Every day | ORAL | 2 refills | Status: DC

## 2016-04-20 MED ORDER — BUPROPION HCL ER (SR) 150 MG PO TB12: ORAL_TABLET | ORAL | 3 refills | Status: DC

## 2016-04-20 MED ORDER — ROSUVASTATIN CALCIUM 5 MG PO TABS: 5.0000 mg | ORAL_TABLET | Freq: Every day | ORAL | 1 refills | Status: DC

## 2016-04-20 NOTE — Progress Notes (Signed)
Patient: Karen Estrada Female    DOB: 11/19/1970   45 y.o.   MRN: [id number] Visit Date: 04/20/2016  Today's Provider: Lelon Huh, MD   Chief Complaint  Patient presents with  . Hospitalization Follow-up   Subjective:    HPI  Follow up Hospitalization  Patient was admitted to [facility] on 04/09/2016 and discharged on 04/14/2016. She was treated for Community Acquired Pneumonia and hypoxia.Labs were checked in the hospital showing slightly elevated CK. Her statin was stopped. Per discharge summary we patient should have labs rechecked to determine of he should restart Pravastatin. Patient was referred for outpatient sleep study and PFT's to further evaluate for COPD. Patient was to complete a 7 day course of antibiotics at home.   She reports good compliance with treatment. She reports this condition is Improved. Patient states she has been having cramps in her feet that keep her up at night.   ------------------------------------------------------------------------------------  We had her reduce pravastatin from 80mg  to 40mg  in September due to leg cramps which improved somewhat, however they became significantly worse with recent hospitalization and pravastatin was discontinued. She states cramps have improved since getting out of hospital, but now resolved.      Allergies  Allergen Reactions  . Fluticasone     Caused nosebleeds  . Lovastatin     Elevated CK (1012)     Current Outpatient Prescriptions:  .  amLODipine (NORVASC) 10 MG tablet, Take 10 mg by mouth daily., Disp: , Rfl:  .  fluticasone (FLONASE) 50 MCG/ACT nasal spray, Place 2 sprays into both nostrils daily., Disp: , Rfl:  .  HYDROcodone-acetaminophen (NORCO/VICODIN) 5-325 MG tablet, Take 1 tablet by mouth every 6 (six) hours as needed for moderate pain., Disp: 30 tablet, Rfl: 0 .  levothyroxine (SYNTHROID, LEVOTHROID) 150 MCG tablet, Take 1 tablet (150 mcg total) by mouth daily before  breakfast., Disp: 30 tablet, Rfl: 3 .  metFORMIN (GLUCOPHAGE) 1000 MG tablet, TAKE ONE-HALF TABLET BY MOUTH IN THE MORNING AND ONE WHOLE TABLET IN THE EVENING, Disp: 45 tablet, Rfl: 0 .  naproxen (NAPROSYN) 500 MG tablet, Take 1 tablet (500 mg total) by mouth 2 (two) times daily as needed., Disp: 30 tablet, Rfl: 3 .  ondansetron (ZOFRAN) 4 MG tablet, Take 1 tablet (4 mg total) by mouth every 8 (eight) hours as needed. for nausea, Disp: 10 tablet, Rfl: 3 .  promethazine (PHENERGAN) 25 MG tablet, Take 1 tablet (25 mg total) by mouth every 6 (six) hours as needed., Disp: 30 tablet, Rfl: 3 .  sertraline (ZOLOFT) 50 MG tablet, Take 1 tablet (50 mg total) by mouth daily., Disp: 30 tablet, Rfl: 12 .  triamcinolone cream (KENALOG) 0.5 %, APPLY CREAM TOPICALLY TO AFFECTED AREA TWICE DAILY, AS DIRECTED, Disp: 30 g, Rfl: 5 .  zolpidem (AMBIEN) 10 MG tablet, TAKE ONE TABLET BY MOUTH AT BEDTIME, Disp: 30 tablet, Rfl: 0 .  pravastatin (PRAVACHOL) 40 MG tablet, Take 1 tablet (40 mg total) by mouth daily. (Patient not taking: Reported on 04/20/2016), Disp: 30 tablet, Rfl: 6  Review of Systems  Constitutional: Negative for appetite change, chills, fatigue and fever.  Respiratory: Negative for chest tightness and shortness of breath.   Cardiovascular: Negative for chest pain and palpitations.  Gastrointestinal: Negative for abdominal pain, nausea and vomiting.  Musculoskeletal: Positive for myalgias (cramps in both feet).  Neurological: Negative for dizziness and weakness.    Social History  Substance Use Topics  . Smoking status:  Former Smoker    Packs/day: 0.75    Years: 9.00    Types: Cigarettes    Quit date: 04/06/2016  . Smokeless tobacco: Never Used  . Alcohol use No   Objective:   Pulse 90   Temp 98.8 F (37.1 C) (Oral)   Resp 20   Wt (!) 338 lb (153.3 kg)   SpO2 93%   BMI 59.87 kg/m   Physical Exam   General Appearance:    Alert, cooperative, no distress  Eyes:    PERRL,  conjunctiva/corneas clear, EOM's intact       Lungs:     Clear to auscultation bilaterally, respirations unlabored  Heart:    Regular rate and rhythm  Neurologic:   Awake, alert, oriented x 3. No apparent focal neurological           defect.           Assessment & Plan:     1. Pneumonia due to infectious organism, unspecified laterality, unspecified part of lung Clinically resolved, finished with antiobiotics.   2. Leg cramping Seems to correlate with pravastatin dosing. Try change to rosuvastatin 5mg  daily and follow up in 1 month. Continue 100mg  Coq10 daily.   3. Hyperlipidemia, unspecified hyperlipidemia type As above.   4. Tobacco abuse Has stopped smoking since hospitalization. Encouraged not to restart smoking. She is now interested in trying bupropion to help with smoking cessation.  - buPROPion (WELLBUTRIN SR) 150 MG 12 hr tablet; 1 tablet daily for 3 days, then 1 tablet twice daily. Stop smoking 14 days after starting medication  Dispense: 60 tablet; Refill: 3  The entirety of the information documented in the History of Present Illness, Review of Systems and Physical Exam were personally obtained by me. Portions of this information were initially documented by Meyer Cory, CMA and reviewed by me for thoroughness and accuracy.    Lelon Huh, MD  [facility]

## 2016-04-29 ENCOUNTER — Telehealth: Payer: Self-pay | Admitting: Family Medicine

## 2016-04-29 DIAGNOSIS — N946 Dysmenorrhea, unspecified: Secondary | ICD-10-CM

## 2016-04-29 NOTE — Telephone Encounter (Signed)
Please review. KW 

## 2016-04-29 NOTE — Telephone Encounter (Signed)
Pt contacted office for refill request on the following medications:  HYDROcodone-acetaminophen (NORCO/VICODIN) 5-325 MG tablet  Last written: 04/01/16 Last OV: 04/20/16  Pt stated that she will run out over the weekend and would like to pick this up by Friday if possible. Please advise. Thanks TNP

## 2016-04-30 MED ORDER — HYDROCODONE-ACETAMINOPHEN 5-325 MG PO TABS: 1.0000 | ORAL_TABLET | Freq: Four times a day (QID) | ORAL | 0 refills | Status: DC | PRN

## 2016-05-03 ENCOUNTER — Other Ambulatory Visit: Payer: Self-pay | Admitting: Family Medicine

## 2016-06-01 ENCOUNTER — Encounter: Payer: Self-pay | Admitting: Family Medicine

## 2016-06-01 ENCOUNTER — Ambulatory Visit (INDEPENDENT_AMBULATORY_CARE_PROVIDER_SITE_OTHER): Payer: 59 | Admitting: Family Medicine

## 2016-06-01 VITALS — BP 132/96 | HR 86 | Temp 98.5°F | Resp 18 | Wt 344.0 lb

## 2016-06-01 DIAGNOSIS — N946 Dysmenorrhea, unspecified: Secondary | ICD-10-CM | POA: Diagnosis not present

## 2016-06-01 DIAGNOSIS — M791 Myalgia, unspecified site: Secondary | ICD-10-CM

## 2016-06-01 DIAGNOSIS — E039 Hypothyroidism, unspecified: Secondary | ICD-10-CM

## 2016-06-01 MED ORDER — HYDROCODONE-ACETAMINOPHEN 5-325 MG PO TABS: 1.0000 | ORAL_TABLET | Freq: Four times a day (QID) | ORAL | 0 refills | Status: DC | PRN

## 2016-06-01 NOTE — Progress Notes (Signed)
Patient: Karen Estrada Female    DOB: 1971/03/25   45 y.o.   MRN: [id number] Visit Date: 06/01/2016  Today's Provider: Lelon Huh, MD   Chief Complaint  Patient presents with  . Follow-up    Leg Cramps and Hyperlipidemia   Subjective:    HPI Follow up Leg Cramping: Patient was last seen for this problem 6 weeks ago. Changes made during that visit includes changing from Pravastatin to Rosuvastatin 5mg  daily and advising patient to follow up in 1 months. Patient was also advised to started Co Q10 100mg  daily. Patient report good compliance with treatment. She states the leg cramps have not improved since changing medications.    Lipid/Cholesterol, Follow-up:   Last seen for this 6 weeks ago.  Management changes since that visit includes changing from Pravastatin to Rosuvastatin 5mg  daily   . Marland Kitchen Last Lipid Panel:    Component Value Date/Time   CHOL 209 (H) 08/30/2015 1644   TRIG 237 (H) 08/30/2015 1644   HDL 37 (L) 08/30/2015 1644   CHOLHDL 5.6 (H) 08/30/2015 1644   LDLCALC 125 (H) 08/30/2015 1644    Risk factors for vascular disease include hypercholesterolemia  She reports good compliance with treatment. She is having side effects. Leg cramps. Current symptoms include none and have been stable. Weight trend: increasing steadily Prior visit with dietician: no Current diet: in general, an "unhealthy" diet Current exercise: walking  Wt Readings from Last 3 Encounters:  04/20/16 (!) 338 lb (153.3 kg)  03/02/16 (!) 342 lb (155.1 kg)  08/30/15 (!) 337 lb (152.9 kg)    -------------------------------------------------------------------   Follow up hypothyroid. Lab Results  Component Value Date   TSH 6.040 (H) 03/27/2016   Levothyroxine increased to 125 after last labs. Patient is tolerating well without adverse effects.     Allergies  Allergen Reactions  . Fluticasone     Caused nosebleeds  . Lovastatin     Elevated CK (1012)     Current  Outpatient Prescriptions:  .  amLODipine (NORVASC) 10 MG tablet, Take 10 mg by mouth daily., Disp: , Rfl:  .  buPROPion (WELLBUTRIN SR) 150 MG 12 hr tablet, 1 tablet daily for 3 days, then 1 tablet twice daily. Stop smoking 14 days after starting medication, Disp: 60 tablet, Rfl: 3 .  Coenzyme Q10 (COQ10) 100 MG CAPS, Take 1 tablet by mouth daily., Disp: , Rfl:  .  fluticasone (FLONASE) 50 MCG/ACT nasal spray, Place 2 sprays into both nostrils daily., Disp: , Rfl:  .  HYDROcodone-acetaminophen (NORCO/VICODIN) 5-325 MG tablet, Take 1 tablet by mouth every 6 (six) hours as needed for moderate pain., Disp: 30 tablet, Rfl: 0 .  levothyroxine (SYNTHROID, LEVOTHROID) 150 MCG tablet, Take 1 tablet (150 mcg total) by mouth daily before breakfast., Disp: 30 tablet, Rfl: 3 .  metFORMIN (GLUCOPHAGE) 1000 MG tablet, TAKE ONE-HALF TABLET BY MOUTH IN THE MORNING AND ONE WHOLE TABLET IN THE EVENING, Disp: 45 tablet, Rfl: 5 .  naproxen (NAPROSYN) 500 MG tablet, Take 1 tablet (500 mg total) by mouth 2 (two) times daily as needed., Disp: 30 tablet, Rfl: 3 .  ondansetron (ZOFRAN) 4 MG tablet, Take 1 tablet (4 mg total) by mouth every 8 (eight) hours as needed. for nausea, Disp: 10 tablet, Rfl: 3 .  promethazine (PHENERGAN) 25 MG tablet, Take 1 tablet (25 mg total) by mouth every 6 (six) hours as needed., Disp: 30 tablet, Rfl: 3 .  rosuvastatin (CRESTOR) 5 MG tablet, Take  1 tablet (5 mg total) by mouth daily., Disp: 30 tablet, Rfl: 1 .  sertraline (ZOLOFT) 50 MG tablet, Take 1 tablet (50 mg total) by mouth daily., Disp: 30 tablet, Rfl: 12 .  triamcinolone cream (KENALOG) 0.5 %, APPLY CREAM TOPICALLY TO AFFECTED AREA TWICE DAILY, AS DIRECTED, Disp: 30 g, Rfl: 5 .  zolpidem (AMBIEN) 10 MG tablet, Take 1 tablet (10 mg total) by mouth at bedtime., Disp: 30 tablet, Rfl: 2  Review of Systems  Constitutional: Negative for appetite change, chills, fatigue and fever.  Respiratory: Negative for chest tightness and shortness of  breath.   Cardiovascular: Positive for leg swelling. Negative for chest pain and palpitations.  Gastrointestinal: Negative for abdominal pain, nausea and vomiting.  Musculoskeletal: Positive for myalgias (leg cramps).  Neurological: Negative for dizziness and weakness.    Social History  Substance Use Topics  . Smoking status: Former Smoker    Packs/day: 0.75    Years: 9.00    Types: Cigarettes    Quit date: 04/06/2016  . Smokeless tobacco: Never Used  . Alcohol use No   Objective:   BP (!) 132/96 (BP Location: Left Arm, Patient Position: Sitting, Cuff Size: Large)   Pulse 86   Temp 98.5 F (36.9 C) (Oral)   Resp 18   Wt (!) 344 lb (156 kg)   SpO2 95% Comment: room air  BMI 60.94 kg/m   Physical Exam   General Appearance:    Alert, cooperative, no distress, obese  Eyes:    PERRL, conjunctiva/corneas clear, EOM's intact       Lungs:     Clear to auscultation bilaterally, respirations unlabored  Heart:    Regular rate and rhythm  Neurologic:   Awake, alert, oriented x 3. No apparent focal neurological           defect.           Assessment & Plan:     1. Hypothyroidism, unspecified type Due to repeat tsh since increasing levothyroxine.  - TSH  2. Dysmenorrhea Needs refill hydrocodone.  - HYDROcodone-acetaminophen (NORCO/VICODIN) 5-325 MG tablet; Take 1 tablet by mouth every 6 (six) hours as needed for moderate pain.  Dispense: 30 tablet; Refill: 0  3. Myalgia Improved since stopping statin, still on CoQ10 which did not seem to help. Consider Zetia if labs normal.  - VITAMIN D 25 Hydroxy (Vit-D Deficiency, Fractures) - Vitamin B12       Lelon Huh, MD  [facility]

## 2016-06-02 ENCOUNTER — Ambulatory Visit: Payer: 59 | Admitting: Family Medicine

## 2016-06-29 ENCOUNTER — Telehealth: Payer: Self-pay | Admitting: Family Medicine

## 2016-06-29 DIAGNOSIS — N946 Dysmenorrhea, unspecified: Secondary | ICD-10-CM

## 2016-06-29 MED ORDER — HYDROCODONE-ACETAMINOPHEN 5-325 MG PO TABS: 1.0000 | ORAL_TABLET | Freq: Four times a day (QID) | ORAL | 0 refills | Status: DC | PRN

## 2016-06-29 NOTE — Telephone Encounter (Signed)
Last fill and LOV was 06/01/16-aa

## 2016-06-29 NOTE — Telephone Encounter (Signed)
Pt contacted office for refill request on the following medications: ° °HYDROcodone-acetaminophen (NORCO/VICODIN) 5-325 MG tablet  ° °CB#[phone]/MW °

## 2016-07-10 DIAGNOSIS — M791 Myalgia: Secondary | ICD-10-CM | POA: Diagnosis not present

## 2016-07-10 DIAGNOSIS — E039 Hypothyroidism, unspecified: Secondary | ICD-10-CM | POA: Diagnosis not present

## 2016-07-11 LAB — VITAMIN B12: VITAMIN B 12: 486 pg/mL (ref 232–1245)

## 2016-07-11 LAB — VITAMIN D 25 HYDROXY (VIT D DEFICIENCY, FRACTURES): Vit D, 25-Hydroxy: 5.3 ng/mL — ABNORMAL LOW (ref 30.0–100.0)

## 2016-07-11 LAB — TSH: TSH: 8.77 u[IU]/mL — ABNORMAL HIGH (ref 0.450–4.500)

## 2016-07-13 ENCOUNTER — Telehealth: Payer: Self-pay

## 2016-07-13 MED ORDER — VITAMIN D (ERGOCALCIFEROL) 1.25 MG (50000 UNIT) PO CAPS: 50000.0000 [IU] | ORAL_CAPSULE | ORAL | 3 refills | Status: DC

## 2016-07-13 MED ORDER — LEVOTHYROXINE SODIUM 175 MCG PO TABS: 175.0000 ug | ORAL_TABLET | Freq: Every day | ORAL | 5 refills | Status: DC

## 2016-07-13 NOTE — Telephone Encounter (Signed)
Pt is returning call.  [id number]

## 2016-07-13 NOTE — Telephone Encounter (Signed)
-----   Message from Birdie Sons, MD sent at 07/12/2016  7:49 PM EST ----- Vitamin d level is extremely low. Is also hyPOthyroid. These conditions can aggravate side effects of cholesterol medication. Need to start vitamin D 50,000 units once a week, #12, rf x 3. Increase levothyroxine to 144mcg daily, #30, rf x 5. Follow up about 6 weeks.

## 2016-07-13 NOTE — Telephone Encounter (Signed)
Left message to call back  

## 2016-07-13 NOTE — Telephone Encounter (Signed)
Advised pt of lab results. Pt verbally acknowledges understanding. Medications sent in and FU scheduled. Renaldo Fiddler, CMA

## 2016-07-31 ENCOUNTER — Other Ambulatory Visit: Payer: Self-pay | Admitting: Family Medicine

## 2016-08-04 ENCOUNTER — Other Ambulatory Visit: Payer: Self-pay

## 2016-08-04 DIAGNOSIS — N946 Dysmenorrhea, unspecified: Secondary | ICD-10-CM

## 2016-08-04 NOTE — Telephone Encounter (Signed)
Patient is requesting medication refill on the HYDROcodone-acetaminophen (NORCO/VICODIN) 5-325 MG tablet  Thanks,  Karen Estrada

## 2016-08-06 MED ORDER — HYDROCODONE-ACETAMINOPHEN 5-325 MG PO TABS: 1.0000 | ORAL_TABLET | Freq: Four times a day (QID) | ORAL | 0 refills | Status: DC | PRN

## 2016-08-24 ENCOUNTER — Ambulatory Visit: Payer: Self-pay | Admitting: Family Medicine

## 2016-08-25 ENCOUNTER — Ambulatory Visit: Payer: Self-pay | Admitting: Family Medicine

## 2016-09-01 ENCOUNTER — Ambulatory Visit (INDEPENDENT_AMBULATORY_CARE_PROVIDER_SITE_OTHER): Payer: 59 | Admitting: Family Medicine

## 2016-09-01 ENCOUNTER — Encounter: Payer: Self-pay | Admitting: Family Medicine

## 2016-09-01 VITALS — BP 136/82 | HR 80 | Temp 98.2°F | Resp 16 | Ht 63.0 in | Wt 347.0 lb

## 2016-09-01 DIAGNOSIS — R739 Hyperglycemia, unspecified: Secondary | ICD-10-CM

## 2016-09-01 DIAGNOSIS — E039 Hypothyroidism, unspecified: Secondary | ICD-10-CM | POA: Diagnosis not present

## 2016-09-01 DIAGNOSIS — E785 Hyperlipidemia, unspecified: Secondary | ICD-10-CM

## 2016-09-01 NOTE — Progress Notes (Signed)
Patient: Karen Estrada Female    DOB: [birth year]   46 y.o.   MRN: [id number] Visit Date: 09/01/2016  Today's Provider: Lelon Huh, MD   Chief Complaint  Patient presents with  . Hypothyroidism  . Vitamin D deficency   Subjective:    HPI Hypothyroidism, follow up: Patient was last seen in the office 3 months ago. Patient's TSH was 8.77 and her levothyroxine was increased to 113mcg daily.  Patient reports that she is tolerating the medication well.  Lab Results  Component Value Date   TSH 8.770 (H) 07/10/2016     Vitamin D deficiency, follow up: Patient was last seen in the office 3 months ago. Since then, patient was advised to start Vit D 50,000units once weekly. Patient reports that she is tolerating the medication well.  Lab Results  Component Value Date   VD25OH 5.3 (L) 07/10/2016    Wt Readings from Last 3 Encounters:  09/01/16 (!) 347 lb (157.4 kg)  06/01/16 (!) 344 lb (156 kg)  04/20/16 (!) 338 lb (153.3 kg)   Follow up pre-diabetes .Her last A1c in September was 6.5. States she is trying to avoid sweets and starchy foods in diet.    Follow up hyperlipidemia.  Has stopped taking pravastatin due to aches and pain. Is trying to avoid saturated fats in diet.     Allergies  Allergen Reactions  . Fluticasone     Caused nosebleeds  . Lovastatin     Elevated CK (1012)     Current Outpatient Prescriptions:  .  Coenzyme Q10 (COQ10) 100 MG CAPS, Take 1 tablet by mouth daily., Disp: , Rfl:  .  HYDROcodone-acetaminophen (NORCO/VICODIN) 5-325 MG tablet, Take 1 tablet by mouth every 6 (six) hours as needed for moderate pain., Disp: 30 tablet, Rfl: 0 .  levothyroxine (SYNTHROID, LEVOTHROID) 175 MCG tablet, Take 1 tablet (175 mcg total) by mouth daily before breakfast., Disp: 30 tablet, Rfl: 5 .  metFORMIN (GLUCOPHAGE) 1000 MG tablet, TAKE ONE-HALF TABLET BY MOUTH IN THE MORNING AND ONE WHOLE TABLET IN THE EVENING, Disp: 45 tablet, Rfl: 5 .  naproxen  (NAPROSYN) 500 MG tablet, Take 1 tablet (500 mg total) by mouth 2 (two) times daily as needed., Disp: 30 tablet, Rfl: 3 .  ondansetron (ZOFRAN) 4 MG tablet, Take 1 tablet (4 mg total) by mouth every 8 (eight) hours as needed. for nausea, Disp: 10 tablet, Rfl: 3 .  promethazine (PHENERGAN) 25 MG tablet, Take 1 tablet (25 mg total) by mouth every 6 (six) hours as needed., Disp: 30 tablet, Rfl: 3 .  sertraline (ZOLOFT) 50 MG tablet, TAKE ONE TABLET BY MOUTH ONCE DAILY, Disp: 30 tablet, Rfl: 12 .  triamcinolone cream (KENALOG) 0.5 %, APPLY CREAM TOPICALLY TO AFFECTED AREA TWICE DAILY, AS DIRECTED, Disp: 30 g, Rfl: 5 .  Vitamin D, Ergocalciferol, (DRISDOL) 50000 units CAPS capsule, Take 1 capsule (50,000 Units total) by mouth every 7 (seven) days., Disp: 12 capsule, Rfl: 3 .  zolpidem (AMBIEN) 10 MG tablet, Take 1 tablet (10 mg total) by mouth at bedtime., Disp: 30 tablet, Rfl: 2 .  buPROPion (WELLBUTRIN SR) 150 MG 12 hr tablet, 1 tablet daily for 3 days, then 1 tablet twice daily. Stop smoking 14 days after starting medication, Disp: 60 tablet, Rfl: 3 .  fluticasone (FLONASE) 50 MCG/ACT nasal spray, Place 2 sprays into both nostrils daily., Disp: , Rfl:  .  rosuvastatin (CRESTOR) 5 MG tablet, Take 1 tablet (5 mg total)  by mouth daily. (Patient not taking: Reported on 09/01/2016), Disp: 30 tablet, Rfl: 1  Review of Systems  Constitutional: Negative.  Negative for appetite change, chills, fatigue and fever.  Respiratory: Negative.  Negative for chest tightness and shortness of breath.   Cardiovascular: Negative.  Negative for chest pain and palpitations.  Gastrointestinal: Negative for abdominal pain, nausea and vomiting.  Musculoskeletal: Negative.   Neurological: Negative.  Negative for dizziness and weakness.    Social History  Substance Use Topics  . Smoking status: Former Smoker    Packs/day: 0.75    Years: 9.00    Types: Cigarettes    Quit date: 04/06/2016  . Smokeless tobacco: Never Used    . Alcohol use No   Objective:   BP 136/82 (BP Location: Right Arm, Patient Position: Sitting, Cuff Size: Large)   Pulse 80   Temp 98.2 F (36.8 C)   Resp 16   Ht 5\' 3"  (1.6 m)   Wt (!) 347 lb (157.4 kg)   BMI 61.47 kg/m  Vitals:   09/01/16 1632  BP: 136/82  Pulse: 80  Resp: 16  Temp: 98.2 F (36.8 C)  Weight: (!) 347 lb (157.4 kg)  Height: 5\' 3"  (1.6 m)     Physical Exam  General Appearance:    Alert, cooperative, no distress, obese  Eyes:    PERRL, conjunctiva/corneas clear, EOM's intact       Lungs:     Clear to auscultation bilaterally, respirations unlabored  Heart:    Regular rate and rhythm  Neurologic:   Awake, alert, oriented x 3. No apparent focal neurological           defect.           Assessment & Plan:     1. Hypothyroidism, unspecified type  - T4 AND TSH  2. Hyperglycemia  - Comprehensive metabolic panel - Hemoglobin A1c  3. Hyperlipidemia, unspecified hyperlipidemia type Off of statin due to side effects.  - Lipid panel  4. Vitamin D deficiency.  Tolerating 50,000 units a week of vitamin D.        Lelon Huh, MD  [facility]

## 2016-09-02 ENCOUNTER — Telehealth: Payer: Self-pay

## 2016-09-02 LAB — COMPREHENSIVE METABOLIC PANEL
A/G RATIO: 1.1 — AB (ref 1.2–2.2)
ALBUMIN: 4 g/dL (ref 3.5–5.5)
ALT: 62 IU/L — ABNORMAL HIGH (ref 0–32)
AST: 61 IU/L — ABNORMAL HIGH (ref 0–40)
Alkaline Phosphatase: 108 IU/L (ref 39–117)
BILIRUBIN TOTAL: 0.4 mg/dL (ref 0.0–1.2)
BUN / CREAT RATIO: 16 (ref 9–23)
BUN: 9 mg/dL (ref 6–24)
CO2: 27 mmol/L (ref 18–29)
Calcium: 9.5 mg/dL (ref 8.7–10.2)
Chloride: 97 mmol/L (ref 96–106)
Creatinine, Ser: 0.56 mg/dL — ABNORMAL LOW (ref 0.57–1.00)
GFR calc non Af Amer: 112 mL/min/{1.73_m2} (ref >59)
GFR, EST AFRICAN AMERICAN: 129 mL/min/{1.73_m2} (ref >59)
GLOBULIN, TOTAL: 3.6 g/dL (ref 1.5–4.5)
Glucose: 137 mg/dL — ABNORMAL HIGH (ref 65–99)
POTASSIUM: 3.6 mmol/L (ref 3.5–5.2)
SODIUM: 139 mmol/L (ref 134–144)
TOTAL PROTEIN: 7.6 g/dL (ref 6.0–8.5)

## 2016-09-02 LAB — HEMOGLOBIN A1C
Est. average glucose Bld gHb Est-mCnc: 137 mg/dL
HEMOGLOBIN A1C: 6.4 % — AB (ref 4.8–5.6)

## 2016-09-02 LAB — T4 AND TSH
T4 TOTAL: 9.1 ug/dL (ref 4.5–12.0)
TSH: 7.35 u[IU]/mL — ABNORMAL HIGH (ref 0.450–4.500)

## 2016-09-02 LAB — LIPID PANEL
Chol/HDL Ratio: 7.4 ratio units — ABNORMAL HIGH (ref 0.0–4.4)
Cholesterol, Total: 268 mg/dL — ABNORMAL HIGH (ref 100–199)
HDL: 36 mg/dL — AB (ref >39)
LDL Calculated: 185 mg/dL — ABNORMAL HIGH (ref 0–99)
TRIGLYCERIDES: 236 mg/dL — AB (ref 0–149)
VLDL Cholesterol Cal: 47 mg/dL — ABNORMAL HIGH (ref 5–40)

## 2016-09-02 NOTE — Telephone Encounter (Signed)
-----   Message from Birdie Sons, MD sent at 09/02/2016  7:50 AM EDT ----- Please have [facility] ADD vitamin D 25-OH to labs drawn 09/02/2016

## 2016-09-02 NOTE — Telephone Encounter (Signed)
Called [facility] to add vitamin D 25-OH. They are going to fax a form to be fill out with the correct information or to let us know if they were able to run the test or not.  Thanks,  -Joseline

## 2016-09-03 ENCOUNTER — Other Ambulatory Visit: Payer: Self-pay | Admitting: *Deleted

## 2016-09-03 ENCOUNTER — Other Ambulatory Visit: Payer: Self-pay | Admitting: Family Medicine

## 2016-09-03 ENCOUNTER — Encounter: Payer: Self-pay | Admitting: Family Medicine

## 2016-09-03 DIAGNOSIS — E785 Hyperlipidemia, unspecified: Secondary | ICD-10-CM

## 2016-09-03 DIAGNOSIS — E559 Vitamin D deficiency, unspecified: Secondary | ICD-10-CM | POA: Insufficient documentation

## 2016-09-03 DIAGNOSIS — E039 Hypothyroidism, unspecified: Secondary | ICD-10-CM

## 2016-09-03 MED ORDER — LEVOTHYROXINE SODIUM 200 MCG PO TABS: 200.0000 ug | ORAL_TABLET | Freq: Every day | ORAL | 3 refills | Status: DC

## 2016-09-03 MED ORDER — ATORVASTATIN CALCIUM 20 MG PO TABS: 20.0000 mg | ORAL_TABLET | Freq: Every day | ORAL | 5 refills | Status: DC

## 2016-09-04 LAB — VITAMIN D 25 HYDROXY (VIT D DEFICIENCY, FRACTURES): VIT D 25 HYDROXY: 15.1 ng/mL — AB (ref 30.0–100.0)

## 2016-09-04 LAB — SPECIMEN STATUS REPORT

## 2016-09-04 NOTE — Telephone Encounter (Signed)
Done  ED 

## 2016-09-04 NOTE — Telephone Encounter (Signed)
Please call in zolpidem  

## 2016-10-03 ENCOUNTER — Other Ambulatory Visit: Payer: Self-pay | Admitting: Family Medicine

## 2016-10-07 ENCOUNTER — Other Ambulatory Visit: Payer: Self-pay | Admitting: Family Medicine

## 2016-10-07 DIAGNOSIS — N946 Dysmenorrhea, unspecified: Secondary | ICD-10-CM

## 2016-10-07 NOTE — Telephone Encounter (Signed)
Pt needs refill   HYDROcodone-acetaminophen (NORCO/VICODIN) 5-325 MG tablet  Thanks Con Memos

## 2016-10-08 MED ORDER — HYDROCODONE-ACETAMINOPHEN 5-325 MG PO TABS: 1.0000 | ORAL_TABLET | Freq: Four times a day (QID) | ORAL | 0 refills | Status: DC | PRN

## 2016-11-10 ENCOUNTER — Other Ambulatory Visit: Payer: Self-pay | Admitting: Family Medicine

## 2016-11-10 DIAGNOSIS — N946 Dysmenorrhea, unspecified: Secondary | ICD-10-CM

## 2016-11-10 NOTE — Telephone Encounter (Signed)
Needs refill on pain med  HYDROcodone-acetaminophen (NORCO/VICODIN) 5-325 MG tablet  Thanks teri

## 2016-11-11 MED ORDER — HYDROCODONE-ACETAMINOPHEN 5-325 MG PO TABS: 1.0000 | ORAL_TABLET | Freq: Four times a day (QID) | ORAL | 0 refills | Status: DC | PRN

## 2016-12-03 ENCOUNTER — Other Ambulatory Visit: Payer: Self-pay | Admitting: Family Medicine

## 2016-12-03 DIAGNOSIS — N946 Dysmenorrhea, unspecified: Secondary | ICD-10-CM

## 2016-12-03 MED ORDER — HYDROCODONE-ACETAMINOPHEN 5-325 MG PO TABS: 1.0000 | ORAL_TABLET | Freq: Four times a day (QID) | ORAL | 0 refills | Status: DC | PRN

## 2016-12-07 ENCOUNTER — Other Ambulatory Visit: Payer: Self-pay | Admitting: Family Medicine

## 2017-01-05 ENCOUNTER — Other Ambulatory Visit: Payer: Self-pay | Admitting: Family Medicine

## 2017-01-05 DIAGNOSIS — E039 Hypothyroidism, unspecified: Secondary | ICD-10-CM

## 2017-01-05 NOTE — Telephone Encounter (Signed)
Please call in zolpidem  

## 2017-01-05 NOTE — Telephone Encounter (Signed)
Rx called in to pharmacy. 

## 2017-01-19 ENCOUNTER — Other Ambulatory Visit: Payer: Self-pay | Admitting: Family Medicine

## 2017-01-19 DIAGNOSIS — N946 Dysmenorrhea, unspecified: Secondary | ICD-10-CM

## 2017-01-19 NOTE — Telephone Encounter (Signed)
Please review. Thanks!  

## 2017-01-19 NOTE — Telephone Encounter (Signed)
Pt contacted office for refill request on the following medications:  HYDROcodone-acetaminophen (NORCO/VICODIN) 5-325 MG  HS#[phone]

## 2017-01-20 MED ORDER — HYDROCODONE-ACETAMINOPHEN 5-325 MG PO TABS: 1.0000 | ORAL_TABLET | Freq: Four times a day (QID) | ORAL | 0 refills | Status: DC | PRN

## 2017-02-24 ENCOUNTER — Other Ambulatory Visit: Payer: Self-pay | Admitting: Family Medicine

## 2017-02-24 DIAGNOSIS — N946 Dysmenorrhea, unspecified: Secondary | ICD-10-CM

## 2017-02-24 MED ORDER — HYDROCODONE-ACETAMINOPHEN 5-325 MG PO TABS: 1.0000 | ORAL_TABLET | Freq: Four times a day (QID) | ORAL | 0 refills | Status: DC | PRN

## 2017-02-24 NOTE — Telephone Encounter (Signed)
Pt contacted office for refill request on the following medications:  HYDROcodone-acetaminophen (NORCO/VICODIN) 5-325 MG tablet   ZH#[phone]

## 2017-03-19 ENCOUNTER — Other Ambulatory Visit: Payer: Self-pay | Admitting: Family Medicine

## 2017-03-19 DIAGNOSIS — E785 Hyperlipidemia, unspecified: Secondary | ICD-10-CM

## 2017-04-12 ENCOUNTER — Other Ambulatory Visit: Payer: Self-pay | Admitting: Family Medicine

## 2017-04-12 DIAGNOSIS — N946 Dysmenorrhea, unspecified: Secondary | ICD-10-CM

## 2017-04-12 MED ORDER — HYDROCODONE-ACETAMINOPHEN 5-325 MG PO TABS: 1.0000 | ORAL_TABLET | Freq: Four times a day (QID) | ORAL | 0 refills | Status: DC | PRN

## 2017-04-12 NOTE — Telephone Encounter (Signed)
Pt contacted office for refill request on the following medications:  HYDROcodone-acetaminophen (NORCO/VICODIN) 5-325 MG tablet  Last Rx: 02/24/17 LOV: 09/01/16 Please advise. Thanks TNP

## 2017-04-13 ENCOUNTER — Other Ambulatory Visit: Payer: Self-pay | Admitting: Family Medicine

## 2017-04-13 DIAGNOSIS — E785 Hyperlipidemia, unspecified: Secondary | ICD-10-CM

## 2017-04-13 NOTE — Telephone Encounter (Signed)
Pharmacy requesting refills. Thanks!  

## 2017-04-22 ENCOUNTER — Ambulatory Visit (INDEPENDENT_AMBULATORY_CARE_PROVIDER_SITE_OTHER): Payer: 59

## 2017-04-22 DIAGNOSIS — Z23 Encounter for immunization: Secondary | ICD-10-CM | POA: Diagnosis not present

## 2017-05-13 ENCOUNTER — Other Ambulatory Visit: Payer: Self-pay | Admitting: Family Medicine

## 2017-05-13 DIAGNOSIS — E785 Hyperlipidemia, unspecified: Secondary | ICD-10-CM

## 2017-05-20 ENCOUNTER — Telehealth: Payer: Self-pay | Admitting: Family Medicine

## 2017-05-20 DIAGNOSIS — N946 Dysmenorrhea, unspecified: Secondary | ICD-10-CM

## 2017-05-20 NOTE — Telephone Encounter (Signed)
Pt contacted office for refill request on the following medications:  HYDROcodone-acetaminophen (NORCO/VICODIN) 5-325 MG tablet  [place]   Last Rx: 04/12/17 LOV: 09/01/16  Please advise. Thanks TNP

## 2017-05-21 NOTE — Telephone Encounter (Signed)
Patient advised. Follow up appointment scheduled for 12/14

## 2017-05-21 NOTE — Telephone Encounter (Signed)
Patient is overdue for follow up of thyroid, cholesterol, and vitamin d. Needs to be schedule this month before we can approve any prescription refills.

## 2017-05-28 ENCOUNTER — Ambulatory Visit (INDEPENDENT_AMBULATORY_CARE_PROVIDER_SITE_OTHER): Payer: 59 | Admitting: Family Medicine

## 2017-05-28 ENCOUNTER — Encounter: Payer: Self-pay | Admitting: Family Medicine

## 2017-05-28 VITALS — BP 138/84 | HR 84 | Temp 98.2°F | Resp 20 | Ht 63.0 in | Wt 354.0 lb

## 2017-05-28 DIAGNOSIS — E039 Hypothyroidism, unspecified: Secondary | ICD-10-CM | POA: Diagnosis not present

## 2017-05-28 DIAGNOSIS — E559 Vitamin D deficiency, unspecified: Secondary | ICD-10-CM | POA: Diagnosis not present

## 2017-05-28 DIAGNOSIS — E785 Hyperlipidemia, unspecified: Secondary | ICD-10-CM

## 2017-05-28 DIAGNOSIS — N946 Dysmenorrhea, unspecified: Secondary | ICD-10-CM

## 2017-05-28 DIAGNOSIS — K1120 Sialoadenitis, unspecified: Secondary | ICD-10-CM

## 2017-05-28 DIAGNOSIS — R739 Hyperglycemia, unspecified: Secondary | ICD-10-CM

## 2017-05-28 DIAGNOSIS — B001 Herpesviral vesicular dermatitis: Secondary | ICD-10-CM

## 2017-05-28 MED ORDER — AMOXICILLIN-POT CLAVULANATE 875-125 MG PO TABS: 1.0000 | ORAL_TABLET | Freq: Two times a day (BID) | ORAL | 0 refills | Status: AC

## 2017-05-28 MED ORDER — VALACYCLOVIR HCL 1 G PO TABS: ORAL_TABLET | ORAL | 1 refills | Status: DC

## 2017-05-28 MED ORDER — HYDROCODONE-ACETAMINOPHEN 5-325 MG PO TABS: 1.0000 | ORAL_TABLET | Freq: Four times a day (QID) | ORAL | 0 refills | Status: DC | PRN

## 2017-05-28 NOTE — Progress Notes (Signed)
Patient: Karen Estrada Female    DOB: [birth year]   46 y.o.   MRN: [id number] Visit Date: 05/28/2017  Today's Provider: Lelon Huh, MD   Chief Complaint  Patient presents with  . Hyperlipidemia  . Hypothyroidism  . Hyperglycemia   Subjective:    HPI   Lipid/Cholesterol, Follow-up:   Last seen for this 9 months ago.  Management since that visit includes; labs checked, started atorvastatin 20 mg qd and COQ10 200 mg qd to reduce muscle aches.  Last Lipid Panel:    Component Value Date/Time   CHOL 268 (H) 09/01/2016 1701   TRIG 236 (H) 09/01/2016 1701   HDL 36 (L) 09/01/2016 1701   CHOLHDL 7.4 (H) 09/01/2016 1701   LDLCALC 185 (H) 09/01/2016 1701    She reports good compliance with treatment. She is not having side effects.   Wt Readings from Last 3 Encounters:  05/28/17 (!) 354 lb (160.6 kg)  09/01/16 (!) 347 lb (157.4 kg)  06/01/16 (!) 344 lb (156 kg)     Hypothyroidism, unspecified type From 09/01/2016-labs checked, increased levothyroxine to 200 mg qd. Patient reports that her neck has been swollen and tender to touch. She reports that the swelling is mainly in the front of her neck. Her symptoms does not come and go.    Lab Results  Component Value Date   TSH 7.350 (H) 09/01/2016    Hyperglycemia From 09/01/2016-labs checked, no changes. Hemoglobin A1c was 6.4.She is tryingg to avoid sweets and starchy foods, but note to have gained 10 pounds over the last year.   Vitamin D deficiency.  From 09/01/2016-labs checked, no changes. She is currently taking Vitamin d 50,000units once weekly. She is tolerating the medication well.  Lab Results  Component Value Date   VD25OH 15.1 (L) 09/01/2016   She also reports sore knot on the front of her neck for the last 2-3 days. It does not hurt when she eats or swallows, no known injury.   She also reports painful cyst on lower lip for the last 3-4 days. She reports she gets cold sores periodically, but OTC  creams are minimally effective.     Allergies  Allergen Reactions  . Fluticasone     Caused nosebleeds  . Lovastatin     Elevated CK (1012)     Current Outpatient Medications:  .  atorvastatin (LIPITOR) 20 MG tablet, TAKE 1 TABLET BY MOUTH ONCE DAILY. APPOINTMENT NEEDED FOR FOLLOW UP, Disp: 90 tablet, Rfl: 4 .  HYDROcodone-acetaminophen (NORCO/VICODIN) 5-325 MG tablet, Take 1 tablet by mouth every 6 (six) hours as needed for moderate pain., Disp: 30 tablet, Rfl: 0 .  levothyroxine (SYNTHROID, LEVOTHROID) 200 MCG tablet, TAKE 1 TABLET BY MOUTH ONCE DAILY BEFORE BREAKFAST, Disp: 30 tablet, Rfl: 5 .  metFORMIN (GLUCOPHAGE) 1000 MG tablet, TAKE ONE-HALF TABLET BY MOUTH IN THE MORNING AND ONE IN THE EVENING, Disp: 45 tablet, Rfl: 5 .  naproxen (NAPROSYN) 500 MG tablet, TAKE 1 TABLET BY MOUTH TWICE DAILY AS NEEDED, Disp: 30 tablet, Rfl: 5 .  ondansetron (ZOFRAN) 4 MG tablet, TAKE ONE TABLET BY MOUTH EVERY 8 HOURS AS NEEDED FOR NAUSEA, Disp: 10 tablet, Rfl: 3 .  promethazine (PHENERGAN) 25 MG tablet, Take 1 tablet (25 mg total) by mouth every 6 (six) hours as needed., Disp: 30 tablet, Rfl: 3 .  sertraline (ZOLOFT) 50 MG tablet, TAKE ONE TABLET BY MOUTH ONCE DAILY, Disp: 30 tablet, Rfl: 12 .  triamcinolone cream (KENALOG)  0.5 %, APPLY CREAM TOPICALLY TO AFFECTED AREA TWICE DAILY, AS DIRECTED, Disp: 30 g, Rfl: 5 .  Vitamin D, Ergocalciferol, (DRISDOL) 50000 units CAPS capsule, Take 1 capsule (50,000 Units total) by mouth every 7 (seven) days., Disp: 12 capsule, Rfl: 3 .  zolpidem (AMBIEN) 10 MG tablet, TAKE 1 TABLET BY MOUTH NIGHTLY AS NEEDED, Disp: 30 tablet, Rfl: 3 .  buPROPion (WELLBUTRIN SR) 150 MG 12 hr tablet, 1 tablet daily for 3 days, then 1 tablet twice daily. Stop smoking 14 days after starting medication, Disp: 60 tablet, Rfl: 3 .  Coenzyme Q10 (COQ10) 100 MG CAPS, Take 1 tablet by mouth daily., Disp: , Rfl:  .  fluticasone (FLONASE) 50 MCG/ACT nasal spray, Place 2 sprays into both  nostrils daily., Disp: , Rfl:    Review of Systems  Constitutional: Negative for appetite change, chills, fatigue and fever.  Respiratory: Negative for chest tightness and shortness of breath.   Cardiovascular: Negative for chest pain and palpitations.  Gastrointestinal: Negative for abdominal pain, nausea and vomiting.  Musculoskeletal: Positive for myalgias and neck pain.  Neurological: Negative for dizziness and weakness.    Social History   Tobacco Use  . Smoking status: Former Smoker    Packs/day: 0.75    Years: 9.00    Pack years: 6.75    Types: Cigarettes    Last attempt to quit: 04/06/2016    Years since quitting: 1.1  . Smokeless tobacco: Never Used  Substance Use Topics  . Alcohol use: No    Alcohol/week: 0.0 oz   Objective:   BP 138/84 (BP Location: Left Arm, Patient Position: Sitting, Cuff Size: Large)   Pulse 84   Temp 98.2 F (36.8 C)   Resp 20   Ht 5\' 3"  (1.6 m)   Wt (!) 354 lb (160.6 kg)   BMI 62.71 kg/m  Vitals:   05/28/17 1628  BP: 138/84  Pulse: 84  Resp: 20  Temp: 98.2 F (36.8 C)  Weight: (!) 354 lb (160.6 kg)  Height: 5\' 3"  (1.6 m)     Physical Exam  General Appearance:    Alert, cooperative, no distress, morbidly obese  ENT:    About 3/4 cm inflamed blistering lesion lower lip just left of midline c/w herpes labialis.  Slightly tender and swollen around submandibular gland.  Eyes:    PERRL, conjunctiva/corneas clear, EOM's intact       Lungs:     Clear to auscultation bilaterally, respirations unlabored  Heart:    Regular rate and rhythm  Neurologic:   Awake, alert, oriented x 3. No apparent focal neurological           defect.           Assessment & Plan:     1. Hypothyroidism, unspecified type On thyroid replacement, due to check thyroid functions.  - TSH  2. Hyperlipidemia, unspecified hyperlipidemia type She is tolerating atorvastatin well with no adverse effects.   - COMPLETE METABOLIC PANEL WITH GFR - Lipid panel  3.  Vitamin D deficiency  - VITAMIN D 25 Hydroxy (Vit-D Deficiency, Fractures)  4. Sialoadenitis start- amoxicillin-clavulanate (AUGMENTIN) 875-125 MG tablet; Take 1 tablet by mouth 2 (two) times daily for 10 days.  Dispense: 20 tablet; Refill: 0  5. Fever blister start- valACYclovir (VALTREX) 1000 MG tablet; 2 tablets onset of cold sore, take 2 more tablets twelve hours later.  Dispense: 24 tablet; Refill: 1  6. Hyperglycemia  - Hemoglobin A1c  7. Dysmenorrhea Needs refill - HYDROcodone-acetaminophen (  NORCO/VICODIN) 5-325 MG tablet; Take 1 tablet by mouth every 6 (six) hours as needed for moderate pain.  Dispense: 30 tablet; Refill: 0  .Addressed extensive list of chronic and acute medical problems today requiring extensive time in counseling and coordination of care.  Over half of this 45 minute visit were spent in counseling and coordinating care of multiple medical problems.       Lelon Huh, MD  [facility]

## 2017-06-16 ENCOUNTER — Other Ambulatory Visit: Payer: Self-pay | Admitting: Family Medicine

## 2017-06-16 DIAGNOSIS — K1120 Sialoadenitis, unspecified: Secondary | ICD-10-CM

## 2017-07-01 ENCOUNTER — Other Ambulatory Visit: Payer: Self-pay | Admitting: Family Medicine

## 2017-07-01 DIAGNOSIS — N946 Dysmenorrhea, unspecified: Secondary | ICD-10-CM

## 2017-07-01 NOTE — Telephone Encounter (Signed)
Pt calling stating she needs a refill on the following medications. Pt use's [facility] for the cream Thanks CC  HYDROcodone-acetaminophen (NORCO/VICODIN) 5-325 MG tablet    triamcinolone cream (KENALOG) 0.5 %

## 2017-07-05 MED ORDER — HYDROCODONE-ACETAMINOPHEN 5-325 MG PO TABS: 1.0000 | ORAL_TABLET | Freq: Four times a day (QID) | ORAL | 0 refills | Status: DC | PRN

## 2017-07-05 MED ORDER — TRIAMCINOLONE ACETONIDE 0.5 % EX CREA: TOPICAL_CREAM | CUTANEOUS | 5 refills | Status: AC

## 2017-07-05 NOTE — Telephone Encounter (Signed)
Patient was advised.  

## 2017-07-09 DIAGNOSIS — R739 Hyperglycemia, unspecified: Secondary | ICD-10-CM | POA: Diagnosis not present

## 2017-07-09 DIAGNOSIS — E039 Hypothyroidism, unspecified: Secondary | ICD-10-CM | POA: Diagnosis not present

## 2017-07-09 DIAGNOSIS — E785 Hyperlipidemia, unspecified: Secondary | ICD-10-CM | POA: Diagnosis not present

## 2017-07-10 LAB — COMPREHENSIVE METABOLIC PANEL
ALK PHOS: 166 IU/L — AB (ref 39–117)
ALT: 56 IU/L — AB (ref 0–32)
AST: 57 IU/L — AB (ref 0–40)
Albumin/Globulin Ratio: 1.1 — ABNORMAL LOW (ref 1.2–2.2)
Albumin: 3.8 g/dL (ref 3.5–5.5)
BILIRUBIN TOTAL: 0.4 mg/dL (ref 0.0–1.2)
BUN/Creatinine Ratio: 26 — ABNORMAL HIGH (ref 9–23)
BUN: 15 mg/dL (ref 6–24)
CHLORIDE: 102 mmol/L (ref 96–106)
CO2: 26 mmol/L (ref 20–29)
Calcium: 9.3 mg/dL (ref 8.7–10.2)
Creatinine, Ser: 0.57 mg/dL (ref 0.57–1.00)
GFR calc non Af Amer: 111 mL/min/{1.73_m2} (ref >59)
GFR, EST AFRICAN AMERICAN: 128 mL/min/{1.73_m2} (ref >59)
GLUCOSE: 181 mg/dL — AB (ref 65–99)
Globulin, Total: 3.6 g/dL (ref 1.5–4.5)
Potassium: 4 mmol/L (ref 3.5–5.2)
Sodium: 144 mmol/L (ref 134–144)
TOTAL PROTEIN: 7.4 g/dL (ref 6.0–8.5)

## 2017-07-10 LAB — LIPID PANEL W/O CHOL/HDL RATIO
CHOLESTEROL TOTAL: 165 mg/dL (ref 100–199)
HDL: 44 mg/dL (ref >39)
LDL Calculated: 91 mg/dL (ref 0–99)
Triglycerides: 148 mg/dL (ref 0–149)
VLDL Cholesterol Cal: 30 mg/dL (ref 5–40)

## 2017-07-10 LAB — TSH: TSH: 2.54 u[IU]/mL (ref 0.450–4.500)

## 2017-07-10 LAB — VITAMIN D 25 HYDROXY (VIT D DEFICIENCY, FRACTURES): Vit D, 25-Hydroxy: 31.3 ng/mL (ref 30.0–100.0)

## 2017-07-10 LAB — HGB A1C W/O EAG: Hgb A1c MFr Bld: 7.4 % — ABNORMAL HIGH (ref 4.8–5.6)

## 2017-07-11 ENCOUNTER — Other Ambulatory Visit: Payer: Self-pay | Admitting: Family Medicine

## 2017-07-13 ENCOUNTER — Telehealth: Payer: Self-pay

## 2017-07-13 DIAGNOSIS — R945 Abnormal results of liver function studies: Principal | ICD-10-CM

## 2017-07-13 DIAGNOSIS — R7989 Other specified abnormal findings of blood chemistry: Secondary | ICD-10-CM

## 2017-07-13 NOTE — Telephone Encounter (Signed)
-----   Message from Birdie Sons, MD sent at 07/12/2017  1:51 PM EST ----- Cholesterol is better at 165. A1c is up yo 7.4%, should be under 7.0%. Need to get more strict with diet, continue current medications. Liver functions are elevated. Need RUQ ultrasound ordered.  Need to schedule diabetes follow up in 3 months.

## 2017-07-13 NOTE — Telephone Encounter (Signed)
LMTCB  Thanks,  -Tierany Appleby 

## 2017-07-14 NOTE — Telephone Encounter (Signed)
Please schedule US   Thanks

## 2017-07-14 NOTE — Telephone Encounter (Signed)
Patient was notified of results. [place] ordered.

## 2017-07-16 ENCOUNTER — Other Ambulatory Visit: Payer: Self-pay

## 2017-07-16 ENCOUNTER — Telehealth: Payer: Self-pay

## 2017-07-16 NOTE — Telephone Encounter (Signed)
Pt advised she will come by and sign the form and will take it to turn it in, need to make a copy of this for our [employer], RMA

## 2017-07-16 NOTE — Telephone Encounter (Signed)
Noted  

## 2017-07-16 NOTE — Telephone Encounter (Signed)
Filled out form that patient needed but patient did not sign it. Form is on Michelle's desk waiting for signature and then need a copy in our chart unless we just faxing [place], RMA

## 2017-07-23 ENCOUNTER — Ambulatory Visit
Admission: RE | Admit: 2017-07-23 | Discharge: 2017-07-23 | Disposition: A | Discharge status: Home / Self Care | Payer: 59 | Source: Ambulatory Visit | Attending: Family Medicine | Admitting: Family Medicine

## 2017-07-23 DIAGNOSIS — R7989 Other specified abnormal findings of blood chemistry: Secondary | ICD-10-CM | POA: Diagnosis not present

## 2017-07-23 DIAGNOSIS — R945 Abnormal results of liver function studies: Secondary | ICD-10-CM | POA: Diagnosis present

## 2017-07-26 ENCOUNTER — Other Ambulatory Visit: Payer: Self-pay | Admitting: Family Medicine

## 2017-07-26 ENCOUNTER — Telehealth: Payer: Self-pay | Admitting: Family Medicine

## 2017-07-26 DIAGNOSIS — R740 Nonspecific elevation of levels of transaminase and lactic acid dehydrogenase [LDH]: Principal | ICD-10-CM

## 2017-07-26 DIAGNOSIS — R7401 Elevation of levels of liver transaminase levels: Secondary | ICD-10-CM

## 2017-07-26 NOTE — Telephone Encounter (Signed)
Order for hepatitis titers due to elevated transaminase

## 2017-07-26 NOTE — Progress Notes (Signed)
error 

## 2017-07-29 ENCOUNTER — Telehealth: Payer: Self-pay | Admitting: *Deleted

## 2017-07-29 NOTE — Telephone Encounter (Signed)
-----   Message from Birdie Sons, MD sent at 07/26/2017  9:47 AM EST ----- Appears to be some chronic inflammatory changes of liver. Need to check hepatitis antibodies. Have printed order. Please leave at front desk for her to pick up.

## 2017-07-29 NOTE — Telephone Encounter (Signed)
LMOVM for pt to return call 

## 2017-07-30 NOTE — Telephone Encounter (Signed)
Patient advised and verbally voiced understanding.  

## 2017-08-05 DIAGNOSIS — R740 Nonspecific elevation of levels of transaminase and lactic acid dehydrogenase [LDH]: Secondary | ICD-10-CM | POA: Diagnosis not present

## 2017-08-06 ENCOUNTER — Encounter: Payer: Self-pay | Admitting: Family Medicine

## 2017-08-06 ENCOUNTER — Other Ambulatory Visit: Payer: Self-pay | Admitting: Family Medicine

## 2017-08-06 ENCOUNTER — Telehealth: Payer: Self-pay | Admitting: *Deleted

## 2017-08-06 DIAGNOSIS — E119 Type 2 diabetes mellitus without complications: Secondary | ICD-10-CM

## 2017-08-06 DIAGNOSIS — N946 Dysmenorrhea, unspecified: Secondary | ICD-10-CM

## 2017-08-06 DIAGNOSIS — E039 Hypothyroidism, unspecified: Secondary | ICD-10-CM

## 2017-08-06 LAB — HEPATITIS B CORE ANTIBODY, TOTAL: HEP B C TOTAL AB: NEGATIVE

## 2017-08-06 LAB — HEPATITIS B SURFACE ANTIBODY,QUALITATIVE: Hep B Surface Ab, Qual: REACTIVE

## 2017-08-06 LAB — HEPATITIS C ANTIBODY: Hep C Virus Ab: <0.1 s/co ratio (ref 0.0–0.9)

## 2017-08-06 MED ORDER — ZOLPIDEM TARTRATE 10 MG PO TABS: 10.0000 mg | ORAL_TABLET | Freq: Every evening | ORAL | 3 refills | Status: DC | PRN

## 2017-08-06 MED ORDER — PIOGLITAZONE HCL 15 MG PO TABS: 15.0000 mg | ORAL_TABLET | Freq: Every day | ORAL | 3 refills | Status: DC

## 2017-08-06 MED ORDER — HYDROCODONE-ACETAMINOPHEN 5-325 MG PO TABS: 1.0000 | ORAL_TABLET | Freq: Four times a day (QID) | ORAL | 0 refills | Status: DC | PRN

## 2017-08-06 NOTE — Telephone Encounter (Signed)
Pt returning call

## 2017-08-06 NOTE — Telephone Encounter (Signed)
-----   Message from Birdie Sons, MD sent at 08/06/2017  8:07 AM EST ----- Hepatitis titers are all negative. Elevated liver enzymes are due to fatty liver which is due to high blood sugar. [facility]. Also recommend she start pioglitazone 15mg  once a day, #30, rf x 3. Schedule follow up o.v. In 3 months to check a1c and liver functions.

## 2017-08-06 NOTE — Telephone Encounter (Signed)
Patient was advised of results. Patient was agreeable to referral to lifestyle center and also to taking pioglitazone. Referral in Epic and rx was sent to pharmacy.

## 2017-08-06 NOTE — Telephone Encounter (Signed)
Please refer to [facility] center. Thanks!

## 2017-08-06 NOTE — Telephone Encounter (Signed)
LMOVM for pt to return call 

## 2017-09-03 ENCOUNTER — Encounter: Payer: Self-pay | Admitting: Family Medicine

## 2017-09-03 ENCOUNTER — Other Ambulatory Visit: Payer: Self-pay | Admitting: Family Medicine

## 2017-09-03 DIAGNOSIS — N946 Dysmenorrhea, unspecified: Secondary | ICD-10-CM

## 2017-09-03 MED ORDER — HYDROCODONE-ACETAMINOPHEN 5-325 MG PO TABS: 1.0000 | ORAL_TABLET | Freq: Four times a day (QID) | ORAL | 0 refills | Status: DC | PRN

## 2017-09-03 MED ORDER — NAPROXEN 500 MG PO TABS: 500.0000 mg | ORAL_TABLET | Freq: Two times a day (BID) | ORAL | 1 refills | Status: DC | PRN

## 2017-09-05 ENCOUNTER — Encounter: Payer: Self-pay | Admitting: Family Medicine

## 2017-09-06 ENCOUNTER — Other Ambulatory Visit: Payer: Self-pay | Admitting: *Deleted

## 2017-09-06 MED ORDER — SERTRALINE HCL 50 MG PO TABS: 50.0000 mg | ORAL_TABLET | Freq: Every day | ORAL | 12 refills | Status: DC

## 2017-09-07 NOTE — Telephone Encounter (Signed)
Done on 09/06/2017

## 2017-10-08 ENCOUNTER — Other Ambulatory Visit: Payer: Self-pay | Admitting: Family Medicine

## 2017-10-08 DIAGNOSIS — N946 Dysmenorrhea, unspecified: Secondary | ICD-10-CM

## 2017-10-10 ENCOUNTER — Other Ambulatory Visit: Payer: Self-pay | Admitting: Family Medicine

## 2017-10-10 ENCOUNTER — Encounter: Payer: Self-pay | Admitting: Family Medicine

## 2017-10-10 DIAGNOSIS — N946 Dysmenorrhea, unspecified: Secondary | ICD-10-CM

## 2017-10-11 MED ORDER — HYDROCODONE-ACETAMINOPHEN 5-325 MG PO TABS: 1.0000 | ORAL_TABLET | Freq: Four times a day (QID) | ORAL | 0 refills | Status: DC | PRN

## 2017-10-12 MED ORDER — CEPHALEXIN 500 MG PO CAPS: 500.0000 mg | ORAL_CAPSULE | Freq: Four times a day (QID) | ORAL | 0 refills | Status: AC

## 2017-10-15 ENCOUNTER — Ambulatory Visit: Payer: Self-pay | Admitting: Family Medicine

## 2017-10-29 ENCOUNTER — Other Ambulatory Visit: Payer: Self-pay

## 2017-10-29 ENCOUNTER — Ambulatory Visit (INDEPENDENT_AMBULATORY_CARE_PROVIDER_SITE_OTHER): Payer: 59 | Admitting: Family Medicine

## 2017-10-29 ENCOUNTER — Encounter: Payer: Self-pay | Admitting: Family Medicine

## 2017-10-29 VITALS — BP 144/80 | Wt 352.0 lb

## 2017-10-29 DIAGNOSIS — E119 Type 2 diabetes mellitus without complications: Secondary | ICD-10-CM | POA: Diagnosis not present

## 2017-10-29 LAB — POCT GLYCOSYLATED HEMOGLOBIN (HGB A1C): Hemoglobin A1C: 8

## 2017-10-29 MED ORDER — PIOGLITAZONE HCL 30 MG PO TABS: 30.0000 mg | ORAL_TABLET | Freq: Every day | ORAL | 5 refills | Status: DC

## 2017-10-29 NOTE — Progress Notes (Signed)
Patient: Karen Estrada Female    DOB: [birth year]   47 y.o.   MRN: [id number] Visit Date: 10/29/2017  Today's Provider: Lelon Huh, MD   Chief Complaint  Patient presents with  . Diabetes   Subjective:    HPI   Diabetes Mellitus Type II, Follow-up:   Lab Results  Component Value Date   HGBA1C 7.4 (H) 07/09/2017   HGBA1C 6.4 (H) 09/01/2016   HGBA1C 6.5 03/02/2016   Last seen for diabetes 5 months ago.  Management since then includes; labs checked. Recommended she get more strict with diet, continue current medications. Liver functions are elevated. RUQ ultrasound ordered showing fatty liver. Is now on pioglitazone which she is tolerating well.  Need to schedule diabetes follow up in 3 months. . She reports good compliance with treatment. She is not having side effects.  Current symptoms include none and have been . Home blood sugar records: no monitor at home  Episodes of hypoglycemia? unknown   Current Insulin Regimen: n/a Most Recent Eye Exam:  September 2018 Weight trend: stable Prior visit with dietician: no Current diet: noncompliant Current exercise: none  ------------------------------------------------------------------------   Elevated Liver Functions From 05/28/2017-US order and done on 07/23/2017, showing some chronic inflammatory changes of liver. Ordered hepatitis antibodies labs. Hepatitis titers are all negative. Elevated liver enzymes are due to fatty liver which is due to high blood sugar. [facility]. Recommended she start pioglitazone 15mg  once a day. Advised to schedule follow up o.v. In 3 months to check a1c and liver functions.    Allergies  Allergen Reactions  . Fluticasone     Caused nosebleeds  . Lovastatin     Elevated CK (1012)     Current Outpatient Medications:  .  atorvastatin (LIPITOR) 20 MG tablet, TAKE 1 TABLET BY MOUTH ONCE DAILY. APPOINTMENT NEEDED FOR FOLLOW UP, Disp: 90 tablet,  Rfl: 4 .  Coenzyme Q10 (COQ10) 100 MG CAPS, Take 1 tablet by mouth daily., Disp: , Rfl:  .  HYDROcodone-acetaminophen (NORCO/VICODIN) 5-325 MG tablet, Take 1 tablet by mouth every 6 (six) hours as needed for moderate pain., Disp: 30 tablet, Rfl: 0 .  levothyroxine (SYNTHROID, LEVOTHROID) 200 MCG tablet, TAKE 1 TABLET BY MOUTH ONCE DAILY BEFORE BREAKFAST, Disp: 30 tablet, Rfl: 11 .  metFORMIN (GLUCOPHAGE) 1000 MG tablet, TAKE ONE-HALF TABLET BY MOUTH IN THE MORNING AND ONE TABLET IN THE EVENING, Disp: 135 tablet, Rfl: 1 .  Milk Thistle 140 MG CAPS, , Disp: , Rfl:  .  naproxen (NAPROSYN) 500 MG tablet, Take 1 tablet (500 mg total) by mouth 2 (two) times daily as needed., Disp: 180 tablet, Rfl: 1 .  ondansetron (ZOFRAN) 4 MG tablet, TAKE ONE TABLET BY MOUTH EVERY 8 HOURS AS NEEDED FOR NAUSEA, Disp: 10 tablet, Rfl: 3 .  pioglitazone (ACTOS) 15 MG tablet, Take 1 tablet (15 mg total) by mouth daily., Disp: 30 tablet, Rfl: 3 .  promethazine (PHENERGAN) 25 MG tablet, Take 1 tablet (25 mg total) by mouth every 6 (six) hours as needed., Disp: 30 tablet, Rfl: 3 .  sertraline (ZOLOFT) 50 MG tablet, Take 1 tablet (50 mg total) by mouth daily., Disp: 30 tablet, Rfl: 12 .  triamcinolone cream (KENALOG) 0.5 %, APPLY CREAM TOPICALLY TO AFFECTED AREA TWICE DAILY, AS DIRECTED, Disp: 30 g, Rfl: 5 .  valACYclovir (VALTREX) 1000 MG tablet, 2 tablets onset of cold sore, take 2 more tablets twelve hours later., Disp: 24 tablet, Rfl:  1 .  Vitamin D, Ergocalciferol, (DRISDOL) 50000 units CAPS capsule, TAKE ONE CAPSULE BY MOUTH ONCE A WEEK, Disp: 12 capsule, Rfl: 1 .  zolpidem (AMBIEN) 10 MG tablet, Take 1 tablet (10 mg total) by mouth at bedtime as needed for sleep., Disp: 30 tablet, Rfl: 3 .  omeprazole (PRILOSEC) 20 MG capsule, Take by mouth., Disp: , Rfl:   Review of Systems  Constitutional: Negative for appetite change, chills, fatigue and fever.  Respiratory: Negative for chest tightness and shortness of breath.     Cardiovascular: Negative for chest pain and palpitations.  Gastrointestinal: Negative for abdominal pain, nausea and vomiting.  Neurological: Negative for dizziness and weakness.    Social History   Tobacco Use  . Smoking status: Former Smoker    Packs/day: 0.75    Years: 9.00    Pack years: 6.75    Types: Cigarettes    Last attempt to quit: 04/06/2016    Years since quitting: 1.5  . Smokeless tobacco: Never Used  Substance Use Topics  . Alcohol use: No    Alcohol/week: 0.0 oz   Objective:   BP (!) 172/100   Wt (!) 352 lb (159.7 kg)   BMI 62.35 kg/m  Vitals:   10/29/17 1546 10/29/17 1605  BP: (!) 172/100 (!) 144/80  Weight: (!) 352 lb (159.7 kg)      Physical Exam   General Appearance:    Alert, cooperative, no distress, morbidly obese  Eyes:    PERRL, conjunctiva/corneas clear, EOM's intact       Lungs:     Clear to auscultation bilaterally, respirations unlabored  Heart:    Regular rate and rhythm  Neurologic:   Awake, alert, oriented x 3. No apparent focal neurological           defect.       Results for orders placed or performed in visit on 10/29/17  POCT glycosylated hemoglobin (Hb A1C)  Result Value Ref Range   Hemoglobin A1C 8        Assessment & Plan:     1. Diabetes mellitus without complication (HCC) Increase pioglitazone to 30mg  daily - POCT glycosylated hemoglobin (Hb A1C) - pioglitazone (ACTOS) 30 MG tablet; Take 1 tablet (30 mg total) by mouth daily.  Dispense: 30 tablet; Refill: 5  2. Morbid obesity ([place]) Has recently started using fitbit and getting around 1000 steps a day. Counseled to gradually work up to 10,000 steps a day.   Return in about 4 months (around 03/01/2018).        Lelon Huh, MD  [facility]

## 2017-10-29 NOTE — Patient Instructions (Signed)
   Increase your daily number of steps by 1,000 every month until you reach a goal of 10,000 steps a day

## 2017-11-09 ENCOUNTER — Other Ambulatory Visit: Payer: Self-pay | Admitting: Family Medicine

## 2017-11-09 DIAGNOSIS — N946 Dysmenorrhea, unspecified: Secondary | ICD-10-CM

## 2017-11-09 MED ORDER — HYDROCODONE-ACETAMINOPHEN 5-325 MG PO TABS: 1.0000 | ORAL_TABLET | Freq: Four times a day (QID) | ORAL | 0 refills | Status: DC | PRN

## 2017-12-10 ENCOUNTER — Other Ambulatory Visit: Payer: Self-pay | Admitting: Family Medicine

## 2017-12-10 DIAGNOSIS — N946 Dysmenorrhea, unspecified: Secondary | ICD-10-CM

## 2017-12-12 MED ORDER — HYDROCODONE-ACETAMINOPHEN 5-325 MG PO TABS: 1.0000 | ORAL_TABLET | Freq: Four times a day (QID) | ORAL | 0 refills | Status: DC | PRN

## 2017-12-31 ENCOUNTER — Other Ambulatory Visit: Payer: Self-pay | Admitting: Family Medicine

## 2018-01-07 ENCOUNTER — Other Ambulatory Visit: Payer: Self-pay | Admitting: Family Medicine

## 2018-01-07 DIAGNOSIS — N946 Dysmenorrhea, unspecified: Secondary | ICD-10-CM

## 2018-01-10 MED ORDER — HYDROCODONE-ACETAMINOPHEN 5-325 MG PO TABS: 1.0000 | ORAL_TABLET | Freq: Four times a day (QID) | ORAL | 0 refills | Status: DC | PRN

## 2018-01-19 ENCOUNTER — Encounter: Payer: Self-pay | Admitting: Family Medicine

## 2018-02-07 ENCOUNTER — Other Ambulatory Visit: Payer: Self-pay | Admitting: Family Medicine

## 2018-02-11 ENCOUNTER — Other Ambulatory Visit: Payer: Self-pay | Admitting: Family Medicine

## 2018-02-11 DIAGNOSIS — N946 Dysmenorrhea, unspecified: Secondary | ICD-10-CM

## 2018-02-11 MED ORDER — HYDROCODONE-ACETAMINOPHEN 5-325 MG PO TABS: 1.0000 | ORAL_TABLET | Freq: Four times a day (QID) | ORAL | 0 refills | Status: DC | PRN

## 2018-03-04 ENCOUNTER — Ambulatory Visit: Payer: Self-pay | Admitting: Family Medicine

## 2018-03-11 ENCOUNTER — Encounter: Payer: Self-pay | Admitting: Family Medicine

## 2018-03-11 ENCOUNTER — Ambulatory Visit (INDEPENDENT_AMBULATORY_CARE_PROVIDER_SITE_OTHER): Payer: 59 | Admitting: Family Medicine

## 2018-03-11 ENCOUNTER — Other Ambulatory Visit: Payer: Self-pay | Admitting: Family Medicine

## 2018-03-11 VITALS — BP 152/104 | HR 84 | Temp 98.1°F | Resp 16

## 2018-03-11 DIAGNOSIS — E559 Vitamin D deficiency, unspecified: Secondary | ICD-10-CM | POA: Diagnosis not present

## 2018-03-11 DIAGNOSIS — E785 Hyperlipidemia, unspecified: Secondary | ICD-10-CM

## 2018-03-11 DIAGNOSIS — K760 Fatty (change of) liver, not elsewhere classified: Secondary | ICD-10-CM | POA: Diagnosis not present

## 2018-03-11 DIAGNOSIS — Z23 Encounter for immunization: Secondary | ICD-10-CM

## 2018-03-11 DIAGNOSIS — I10 Essential (primary) hypertension: Secondary | ICD-10-CM

## 2018-03-11 DIAGNOSIS — I152 Hypertension secondary to endocrine disorders: Secondary | ICD-10-CM

## 2018-03-11 DIAGNOSIS — E119 Type 2 diabetes mellitus without complications: Secondary | ICD-10-CM | POA: Diagnosis not present

## 2018-03-11 DIAGNOSIS — E1159 Type 2 diabetes mellitus with other circulatory complications: Secondary | ICD-10-CM

## 2018-03-11 DIAGNOSIS — N946 Dysmenorrhea, unspecified: Secondary | ICD-10-CM

## 2018-03-11 HISTORY — DX: Fatty (change of) liver, not elsewhere classified: K76.0

## 2018-03-11 LAB — POCT GLYCOSYLATED HEMOGLOBIN (HGB A1C)
Est. average glucose Bld gHb Est-mCnc: 174
HEMOGLOBIN A1C: 7.7 % — AB (ref 4.0–5.6)

## 2018-03-11 MED ORDER — PROMETHAZINE HCL 25 MG PO TABS: 25.0000 mg | ORAL_TABLET | Freq: Four times a day (QID) | ORAL | 3 refills | Status: DC | PRN

## 2018-03-11 MED ORDER — LISINOPRIL 10 MG PO TABS: 10.0000 mg | ORAL_TABLET | Freq: Every day | ORAL | 1 refills | Status: DC

## 2018-03-11 MED ORDER — ONDANSETRON HCL 4 MG PO TABS: 4.0000 mg | ORAL_TABLET | Freq: Three times a day (TID) | ORAL | 3 refills | Status: DC | PRN

## 2018-03-11 MED ORDER — HYDROCODONE-ACETAMINOPHEN 5-325 MG PO TABS: 1.0000 | ORAL_TABLET | Freq: Four times a day (QID) | ORAL | 0 refills | Status: DC | PRN

## 2018-03-11 NOTE — Progress Notes (Signed)
Patient: Karen Estrada Female    DOB: [birth year]   47 y.o.   MRN: [id number] Visit Date: 03/11/2018  Today's Provider: Lelon Huh, MD   Chief Complaint  Patient presents with  . Follow-up  . Diabetes   Subjective:    HPI   Diabetes Mellitus Type II, Follow-up:   Lab Results  Component Value Date   HGBA1C 8 10/29/2017   HGBA1C 7.4 (H) 07/09/2017   HGBA1C 6.4 (H) 09/01/2016   Last seen for diabetes 4 months ago.  Management since then includes; increased Actos to 30 mg qd.States she frequently missing pioglitazone.  She reports fair compliance with treatment. She is not having side effects. none Current symptoms include none and have been unchanged. Home blood sugar records: fasting range: not checking due to not having a glucose monitor  Episodes of hypoglycemia? no   Current Insulin Regimen: n/a Most Recent Eye Exam: overdue Weight trend: patient refuses to weigh Prior visit with dietician: no Current diet: noncomplinant Current exercise: none  ---------------------------------------------------------------  Morbid obesity ([place]) From 10/29/2017-Counseled to gradually work up to 10,000 steps a day.   BP Readings from Last 5 Encounters:  03/11/18 (!) 152/104  10/29/17 (!) 144/80  05/28/17 138/84  09/01/16 136/82  06/01/16 (!) 132/96      Allergies  Allergen Reactions  . Fluticasone     Caused nosebleeds  . Lovastatin     Elevated CK (1012)     Current Outpatient Medications:  .  atorvastatin (LIPITOR) 20 MG tablet, TAKE 1 TABLET BY MOUTH ONCE DAILY. APPOINTMENT NEEDED FOR FOLLOW UP, Disp: 90 tablet, Rfl: 4 .  Coenzyme Q10 (COQ10) 100 MG CAPS, Take 1 tablet by mouth daily., Disp: , Rfl:  .  HYDROcodone-acetaminophen (NORCO/VICODIN) 5-325 MG tablet, Take 1 tablet by mouth every 6 (six) hours as needed for moderate pain., Disp: 30 tablet, Rfl: 0 .  levothyroxine (SYNTHROID, LEVOTHROID) 200 MCG tablet, TAKE 1 TABLET BY MOUTH ONCE DAILY  BEFORE BREAKFAST, Disp: 30 tablet, Rfl: 11 .  metFORMIN (GLUCOPHAGE) 1000 MG tablet, TAKE 1/2 (ONE-HALF) TABLET BY MOUTH IN THE MORNING AND 1 IN THE EVENING, Disp: 135 tablet, Rfl: 2 .  Milk Thistle 140 MG CAPS, , Disp: , Rfl:  .  naproxen (NAPROSYN) 500 MG tablet, Take 1 tablet (500 mg total) by mouth 2 (two) times daily as needed., Disp: 180 tablet, Rfl: 1 .  omeprazole (PRILOSEC) 20 MG capsule, Take by mouth., Disp: , Rfl:  .  ondansetron (ZOFRAN) 4 MG tablet, TAKE ONE TABLET BY MOUTH EVERY 8 HOURS AS NEEDED FOR NAUSEA, Disp: 10 tablet, Rfl: 3 .  pioglitazone (ACTOS) 30 MG tablet, Take 1 tablet (30 mg total) by mouth daily., Disp: 30 tablet, Rfl: 5 .  promethazine (PHENERGAN) 25 MG tablet, Take 1 tablet (25 mg total) by mouth every 6 (six) hours as needed., Disp: 30 tablet, Rfl: 3 .  sertraline (ZOLOFT) 50 MG tablet, Take 1 tablet (50 mg total) by mouth daily., Disp: 30 tablet, Rfl: 12 .  triamcinolone cream (KENALOG) 0.5 %, APPLY CREAM TOPICALLY TO AFFECTED AREA TWICE DAILY, AS DIRECTED, Disp: 30 g, Rfl: 5 .  valACYclovir (VALTREX) 1000 MG tablet, 2 tablets onset of cold sore, take 2 more tablets twelve hours later., Disp: 24 tablet, Rfl: 1 .  Vitamin D, Ergocalciferol, (DRISDOL) 50000 units CAPS capsule, TAKE 1 CAPSULE BY MOUTH ONCE A WEEK, Disp: 12 capsule, Rfl: 4 .  zolpidem (AMBIEN) 10 MG tablet, TAKE 1 TABLET BY  MOUTH AT BEDTIME AS NEEDED FOR SLEEP, Disp: 30 tablet, Rfl: 3  Review of Systems  Constitutional: Negative for appetite change, chills, fatigue and fever.  Respiratory: Negative for chest tightness and shortness of breath.   Cardiovascular: Negative for chest pain and palpitations.  Gastrointestinal: Negative for abdominal pain, nausea and vomiting.  Neurological: Negative for dizziness and weakness.    Social History   Tobacco Use  . Smoking status: Former Smoker    Packs/day: 0.75    Years: 9.00    Pack years: 6.75    Types: Cigarettes    Last attempt to quit:  04/06/2016    Years since quitting: 1.9  . Smokeless tobacco: Never Used  Substance Use Topics  . Alcohol use: No    Alcohol/week: 0.0 standard drinks   Objective:   BP (!) 152/104 (BP Location: Right Arm, Cuff Size: Large)   Pulse 84   Temp 98.1 F (36.7 C) (Oral)   Resp 16   SpO2 96%  Vitals:   03/11/18 1553 03/11/18 1604  BP: (!) 164/100 (!) 152/104  Pulse: 84   Resp: 16   Temp: 98.1 F (36.7 C)   TempSrc: Oral   SpO2: 96%      Physical Exam  General Appearance:    Alert, cooperative, no distress, obese  Eyes:    PERRL, conjunctiva/corneas clear, EOM's intact       Lungs:     Clear to auscultation bilaterally, respirations unlabored  Heart:    Regular rate and rhythm  Neurologic:   Awake, alert, oriented x 3. No apparent focal neurological           defect.        Results for orders placed or performed in visit on 03/11/18  POCT glycosylated hemoglobin (Hb A1C)  Result Value Ref Range   Hemoglobin A1C 7.7 (A) 4.0 - 5.6 %   HbA1c POC (<> result, manual entry)     HbA1c, POC (prediabetic range)     HbA1c, POC (controlled diabetic range)     Est. average glucose Bld gHb Est-mCnc 174        Assessment & Plan:     1. Diabetes mellitus with coincident hypertension ([place]) Slightly improved, but not taking pioglitazone consistently. She is going to work on trying to take every day.  - POCT glycosylated hemoglobin (Hb A1C)  2. Hyperlipidemia, unspecified hyperlipidemia type She is tolerating atorvastatin well with no adverse effects.   - Lipid panel - CBC - Comprehensive metabolic panel  3. Vitamin D deficiency  - VITAMIN D 25 Hydroxy (Vit-D Deficiency, Fractures)  4. Fatty liver Check liver enzymes today.   5. Essential hypertension start- lisinopril (PRINIVIL,ZESTRIL) 10 MG tablet; Take 1 tablet (10 mg total) by mouth daily.  Dispense: 30 tablet; Refill: 1  Tdap and flu vaccine given today.   Return in about 3 months (around 06/10/2018).        Lelon Huh, MD  [facility]

## 2018-03-25 DIAGNOSIS — E559 Vitamin D deficiency, unspecified: Secondary | ICD-10-CM | POA: Diagnosis not present

## 2018-03-25 DIAGNOSIS — E785 Hyperlipidemia, unspecified: Secondary | ICD-10-CM | POA: Diagnosis not present

## 2018-03-26 LAB — COMPREHENSIVE METABOLIC PANEL
A/G RATIO: 1.2 (ref 1.2–2.2)
ALBUMIN: 3.9 g/dL (ref 3.5–5.5)
ALK PHOS: 155 IU/L — AB (ref 39–117)
ALT: 49 IU/L — ABNORMAL HIGH (ref 0–32)
AST: 56 IU/L — AB (ref 0–40)
BUN / CREAT RATIO: 19 (ref 9–23)
BUN: 12 mg/dL (ref 6–24)
Bilirubin Total: 0.4 mg/dL (ref 0.0–1.2)
CO2: 25 mmol/L (ref 20–29)
Calcium: 9.1 mg/dL (ref 8.7–10.2)
Chloride: 101 mmol/L (ref 96–106)
Creatinine, Ser: 0.63 mg/dL (ref 0.57–1.00)
GFR calc Af Amer: 124 mL/min/{1.73_m2} (ref >59)
GFR, EST NON AFRICAN AMERICAN: 107 mL/min/{1.73_m2} (ref >59)
GLOBULIN, TOTAL: 3.3 g/dL (ref 1.5–4.5)
Glucose: 153 mg/dL — ABNORMAL HIGH (ref 65–99)
POTASSIUM: 3.8 mmol/L (ref 3.5–5.2)
SODIUM: 140 mmol/L (ref 134–144)
Total Protein: 7.2 g/dL (ref 6.0–8.5)

## 2018-03-26 LAB — CBC
HEMATOCRIT: 33.1 % — AB (ref 34.0–46.6)
Hemoglobin: 10.2 g/dL — ABNORMAL LOW (ref 11.1–15.9)
MCH: 22.5 pg — ABNORMAL LOW (ref 26.6–33.0)
MCHC: 30.8 g/dL — AB (ref 31.5–35.7)
MCV: 73 fL — AB (ref 79–97)
Platelets: 200 10*3/uL (ref 150–450)
RBC: 4.53 x10E6/uL (ref 3.77–5.28)
RDW: 16.1 % — AB (ref 12.3–15.4)
WBC: 5.2 10*3/uL (ref 3.4–10.8)

## 2018-03-26 LAB — LIPID PANEL
Chol/HDL Ratio: 4.3 ratio (ref 0.0–4.4)
Cholesterol, Total: 162 mg/dL (ref 100–199)
HDL: 38 mg/dL — AB (ref >39)
LDL Calculated: 87 mg/dL (ref 0–99)
Triglycerides: 184 mg/dL — ABNORMAL HIGH (ref 0–149)
VLDL CHOLESTEROL CAL: 37 mg/dL (ref 5–40)

## 2018-03-26 LAB — VITAMIN D 25 HYDROXY (VIT D DEFICIENCY, FRACTURES): VIT D 25 HYDROXY: 29.9 ng/mL — AB (ref 30.0–100.0)

## 2018-04-10 ENCOUNTER — Other Ambulatory Visit: Payer: Self-pay | Admitting: Family Medicine

## 2018-04-15 ENCOUNTER — Other Ambulatory Visit: Payer: Self-pay | Admitting: Family Medicine

## 2018-04-15 DIAGNOSIS — N946 Dysmenorrhea, unspecified: Secondary | ICD-10-CM

## 2018-04-15 MED ORDER — HYDROCODONE-ACETAMINOPHEN 5-325 MG PO TABS: 1.0000 | ORAL_TABLET | Freq: Four times a day (QID) | ORAL | 0 refills | Status: DC | PRN

## 2018-05-13 ENCOUNTER — Other Ambulatory Visit: Payer: Self-pay | Admitting: Family Medicine

## 2018-05-13 DIAGNOSIS — I10 Essential (primary) hypertension: Secondary | ICD-10-CM

## 2018-05-13 DIAGNOSIS — E119 Type 2 diabetes mellitus without complications: Secondary | ICD-10-CM

## 2018-05-17 ENCOUNTER — Other Ambulatory Visit: Payer: Self-pay | Admitting: Family Medicine

## 2018-05-17 DIAGNOSIS — N946 Dysmenorrhea, unspecified: Secondary | ICD-10-CM

## 2018-05-17 MED ORDER — HYDROCODONE-ACETAMINOPHEN 5-325 MG PO TABS: 1.0000 | ORAL_TABLET | Freq: Four times a day (QID) | ORAL | 0 refills | Status: DC | PRN

## 2018-06-13 ENCOUNTER — Other Ambulatory Visit: Payer: Self-pay | Admitting: Family Medicine

## 2018-06-13 DIAGNOSIS — I10 Essential (primary) hypertension: Secondary | ICD-10-CM

## 2018-06-13 DIAGNOSIS — N946 Dysmenorrhea, unspecified: Secondary | ICD-10-CM

## 2018-06-13 DIAGNOSIS — E119 Type 2 diabetes mellitus without complications: Secondary | ICD-10-CM

## 2018-06-19 ENCOUNTER — Other Ambulatory Visit: Payer: Self-pay | Admitting: Family Medicine

## 2018-06-19 DIAGNOSIS — N946 Dysmenorrhea, unspecified: Secondary | ICD-10-CM

## 2018-06-20 MED ORDER — HYDROCODONE-ACETAMINOPHEN 5-325 MG PO TABS: 1.0000 | ORAL_TABLET | Freq: Four times a day (QID) | ORAL | 0 refills | Status: DC | PRN

## 2018-07-15 ENCOUNTER — Other Ambulatory Visit: Payer: Self-pay | Admitting: Family Medicine

## 2018-07-15 ENCOUNTER — Ambulatory Visit (INDEPENDENT_AMBULATORY_CARE_PROVIDER_SITE_OTHER): Payer: 59 | Admitting: Family Medicine

## 2018-07-15 ENCOUNTER — Encounter: Payer: Self-pay | Admitting: Family Medicine

## 2018-07-15 VITALS — BP 138/88 | HR 94 | Temp 98.4°F | Resp 20 | Wt 350.0 lb

## 2018-07-15 DIAGNOSIS — E119 Type 2 diabetes mellitus without complications: Secondary | ICD-10-CM | POA: Diagnosis not present

## 2018-07-15 DIAGNOSIS — E785 Hyperlipidemia, unspecified: Secondary | ICD-10-CM | POA: Diagnosis not present

## 2018-07-15 DIAGNOSIS — I10 Essential (primary) hypertension: Secondary | ICD-10-CM

## 2018-07-15 LAB — POCT GLYCOSYLATED HEMOGLOBIN (HGB A1C)
Est. average glucose Bld gHb Est-mCnc: 223
Hemoglobin A1C: 9.4 % — AB (ref 4.0–5.6)

## 2018-07-15 MED ORDER — DULAGLUTIDE 1.5 MG/0.5ML ~~LOC~~ SOAJ: 1.5000 mg | SUBCUTANEOUS | 0 refills | Status: DC

## 2018-07-15 MED ORDER — DULAGLUTIDE 0.75 MG/0.5ML ~~LOC~~ SOAJ: SUBCUTANEOUS | 0 refills | Status: AC

## 2018-07-15 MED ORDER — ATORVASTATIN CALCIUM 20 MG PO TABS: ORAL_TABLET | ORAL | 1 refills | Status: DC

## 2018-07-15 NOTE — Patient Instructions (Signed)
.   Please review the attached list of medications and notify my office if there are any errors.   . Please bring all of your medications to every appointment so we can make sure that our medication list is the same as yours.   

## 2018-07-15 NOTE — Progress Notes (Signed)
Patient: Karen Estrada Female    DOB: 25-Aug-1970   48 y.o.   MRN: [id number] Visit Date: 07/15/2018  Today's Provider: Lelon Huh, MD   Chief Complaint  Patient presents with  . Diabetes  . Hyperlipidemia  . Hypertension   Subjective:     HPI  Diabetes Mellitus Type II, Follow-up:   Lab Results  Component Value Date   HGBA1C 7.7 (A) 03/11/2018   HGBA1C 8 10/29/2017   HGBA1C 7.4 (H) 07/09/2017   Last seen for diabetes 4 months ago.  Management since then includes no changes. She reports good compliance with treatment. She is having side effects. Patient states she has not been taking Pioglitazone due to it causing diarrhea. Current symptoms include numbess in fingers and have been stable. Home blood sugar records: blood sugar are not checked at home  Episodes of hypoglycemia? no   Current Insulin Regimen: none Most Recent Eye Exam: >1 year ago Weight trend: fluctuating a bit Prior visit with dietician: no Current diet: in general, an "unhealthy" diet Current exercise: none  ------------------------------------------------------------------------   Hypertension, follow-up:  BP Readings from Last 3 Encounters:  07/15/18 138/88  03/11/18 (!) 152/104  10/29/17 (!) 144/80    She was last seen for hypertension 4 months ago.  BP at that visit was 152/104. Management since that visit includes started Lisinopril 10mg  daily.She reports good compliance with treatment. She is not having side effects.  She is not exercising. She is adherent to low salt diet.   Outside blood pressures are 120/80. She is experiencing none.  Patient denies chest pain, chest pressure/discomfort, claudication, dyspnea, exertional chest pressure/discomfort, fatigue, irregular heart beat, lower extremity edema, near-syncope, orthopnea, palpitations, paroxysmal nocturnal dyspnea, syncope and tachypnea.   Cardiovascular risk factors include diabetes mellitus, dyslipidemia,  hypertension and obesity (BMI >= 30 kg/m2).  Use of agents associated with hypertension: thyroid hormones.   ------------------------------------------------------------------------    Lipid/Cholesterol, Follow-up:   Last seen for this 6 months ago.  Management since that visit includes no changes.  Last Lipid Panel:    Component Value Date/Time   CHOL 162 03/25/2018 1544   TRIG 184 (H) 03/25/2018 1544   HDL 38 (L) 03/25/2018 1544   CHOLHDL 4.3 03/25/2018 1544   LDLCALC 87 03/25/2018 1544    She reports good compliance with treatment. Patient has been out of medication for the past 2 weeks. She is not having side effects.   Wt Readings from Last 3 Encounters:  07/15/18 (!) 350 lb (158.8 kg)  10/29/17 (!) 352 lb (159.7 kg)  05/28/17 (!) 354 lb (160.6 kg)    ------------------------------------------------------------------------  Allergies  Allergen Reactions  . Fluticasone     Caused nosebleeds  . Lovastatin     Elevated CK (1012)     Current Outpatient Medications:  .  atorvastatin (LIPITOR) 20 MG tablet, TAKE 1 TABLET BY MOUTH ONCE DAILY. APPOINTMENT NEEDED FOR FOLLOW UP, Disp: 90 tablet, Rfl: 4 .  Coenzyme Q10 (COQ10) 100 MG CAPS, Take 1 tablet by mouth daily., Disp: , Rfl:  .  HYDROcodone-acetaminophen (NORCO/VICODIN) 5-325 MG tablet, Take 1 tablet by mouth every 6 (six) hours as needed for moderate pain., Disp: 30 tablet, Rfl: 0 .  levothyroxine (SYNTHROID, LEVOTHROID) 200 MCG tablet, TAKE 1 TABLET BY MOUTH ONCE DAILY BEFORE BREAKFAST, Disp: 30 tablet, Rfl: 11 .  lisinopril (PRINIVIL,ZESTRIL) 10 MG tablet, TAKE 1 TABLET BY MOUTH ONCE DAILY. SCHEDULE OFFICE VISIT FOR FOLLOW UP., Disp: 30 tablet, Rfl: 1 .  metFORMIN (GLUCOPHAGE) 1000 MG tablet, TAKE 1/2 (ONE-HALF) TABLET BY MOUTH IN THE MORNING AND 1 IN THE EVENING, Disp: 135 tablet, Rfl: 2 .  Milk Thistle 140 MG CAPS, , Disp: , Rfl:  .  naproxen (NAPROSYN) 500 MG tablet, Take 1 tablet (500 mg total) by mouth 2  (two) times daily as needed., Disp: 180 tablet, Rfl: 1 .  naproxen (NAPROSYN) 500 MG tablet, TAKE 1 TABLET BY MOUTH TWICE DAILY AS NEEDED, Disp: 90 tablet, Rfl: 4 .  omeprazole (PRILOSEC) 20 MG capsule, Take by mouth., Disp: , Rfl:  .  ondansetron (ZOFRAN) 4 MG tablet, Take 1 tablet (4 mg total) by mouth every 8 (eight) hours as needed. for nausea, Disp: 10 tablet, Rfl: 3 .  promethazine (PHENERGAN) 25 MG tablet, TAKE 1 TABLET BY MOUTH EVERY 6 HOURS AS NEEDED, Disp: 30 tablet, Rfl: 3 .  sertraline (ZOLOFT) 50 MG tablet, Take 1 tablet (50 mg total) by mouth daily., Disp: 30 tablet, Rfl: 12 .  triamcinolone cream (KENALOG) 0.5 %, APPLY CREAM TOPICALLY TO AFFECTED AREA TWICE DAILY, AS DIRECTED, Disp: 30 g, Rfl: 5 .  valACYclovir (VALTREX) 1000 MG tablet, 2 tablets onset of cold sore, take 2 more tablets twelve hours later., Disp: 24 tablet, Rfl: 1 .  Vitamin D, Ergocalciferol, (DRISDOL) 50000 units CAPS capsule, TAKE 1 CAPSULE BY MOUTH ONCE A WEEK, Disp: 12 capsule, Rfl: 4 .  zolpidem (AMBIEN) 10 MG tablet, TAKE 1 TABLET BY MOUTH AT BEDTIME AS NEEDED FOR SLEEP, Disp: 30 tablet, Rfl: 0 .  pioglitazone (ACTOS) 30 MG tablet, TAKE 1 TABLET BY MOUTH ONCE DAILY. SCHEDULE OFFICE VISIT FOR FOLLOW UP (Patient not taking: Reported on 07/15/2018), Disp: 30 tablet, Rfl: 1  Review of Systems  Constitutional: Negative for appetite change, chills, fatigue and fever.  Respiratory: Negative for chest tightness and shortness of breath.   Cardiovascular: Negative for chest pain and palpitations.  Gastrointestinal: Negative for abdominal pain, nausea and vomiting.  Neurological: Positive for numbness (in fingers). Negative for dizziness and weakness.    Social History   Tobacco Use  . Smoking status: Former Smoker    Packs/day: 0.75    Years: 9.00    Pack years: 6.75    Types: Cigarettes    Last attempt to quit: 04/06/2016    Years since quitting: 2.2  . Smokeless tobacco: Never Used  Substance Use Topics  .  Alcohol use: No    Alcohol/week: 0.0 standard drinks      Objective:   BP 138/88 (BP Location: Left Arm, Cuff Size: Large)   Pulse 94   Temp 98.4 F (36.9 C) (Oral)   Resp 20   Wt (!) 350 lb (158.8 kg)   SpO2 96% Comment: room air  BMI 62.00 kg/m  Vitals:   07/15/18 1640 07/15/18 1643  BP: (!) 140/96 138/88  Pulse: 94   Resp: 20   Temp: 98.4 F (36.9 C)   TempSrc: Oral   SpO2: 96%   Weight: (!) 350 lb (158.8 kg)      Physical Exam  General Appearance:    Alert, cooperative, no distress, obese  Eyes:    PERRL, conjunctiva/corneas clear, EOM's intact       Lungs:     Clear to auscultation bilaterally, respirations unlabored  Heart:    Regular rate and rhythm  Neurologic:   Awake, alert, oriented x 3. No apparent focal neurological           defect.  Results for orders placed or performed in visit on 07/15/18  POCT HgB A1C  Result Value Ref Range   Hemoglobin A1C 9.4 (A) 4.0 - 5.6 %   HbA1c POC (<> result, manual entry)     HbA1c, POC (prediabetic range)     HbA1c, POC (controlled diabetic range)     Est. average glucose Bld gHb Est-mCnc 223        Assessment & Plan    1. Diabetes mellitus with coincident hypertension ([place]) Uncontrolled since having to stop pioglitazone. Counseled on risk/benefit GLP1-agonists. Will start  - Dulaglutide (TRULICITY) 1.44 RX/5.4MG SOPN; Inject 0.75mg  once a week for 4 weeks, then 1.5mg  once a week  Dispense: 6 pen; Refill: 0  2. Hyperlipidemia, unspecified hyperlipidemia type She is tolerating atorvastatin well with no adverse effects.  Refilled today.  - atorvastatin (LIPITOR) 20 MG tablet; TAKE 1 TABLET BY MOUTH ONCE DAILY  Dispense: 90 tablet; Refill: 1  3. Essential hypertension BP up some today. Continue current medications.  Will hopefully have some weight loss with GLP1 agonist.   Future Appointments  Date Time Provider [place]  08/26/2018  4:00 PM Fisher, Kirstie Peri, MD [facility] None        Lelon Huh, MD  [facility]

## 2018-07-16 ENCOUNTER — Other Ambulatory Visit: Payer: Self-pay | Admitting: Family Medicine

## 2018-07-16 DIAGNOSIS — I10 Essential (primary) hypertension: Secondary | ICD-10-CM

## 2018-08-10 ENCOUNTER — Other Ambulatory Visit: Payer: Self-pay | Admitting: Family Medicine

## 2018-08-10 DIAGNOSIS — N946 Dysmenorrhea, unspecified: Secondary | ICD-10-CM

## 2018-08-11 MED ORDER — HYDROCODONE-ACETAMINOPHEN 5-325 MG PO TABS: 1.0000 | ORAL_TABLET | Freq: Four times a day (QID) | ORAL | 0 refills | Status: DC | PRN

## 2018-08-13 ENCOUNTER — Encounter: Payer: Self-pay | Admitting: Family Medicine

## 2018-08-13 DIAGNOSIS — E039 Hypothyroidism, unspecified: Secondary | ICD-10-CM

## 2018-08-15 MED ORDER — LEVOTHYROXINE SODIUM 200 MCG PO TABS: ORAL_TABLET | ORAL | 1 refills | Status: DC

## 2018-08-15 NOTE — Telephone Encounter (Signed)
Please review for Dr. Fisher.   Thanks,   -Laura  

## 2018-08-17 ENCOUNTER — Telehealth: Payer: 59 | Admitting: Nurse Practitioner

## 2018-08-17 DIAGNOSIS — H60501 Unspecified acute noninfective otitis externa, right ear: Secondary | ICD-10-CM | POA: Diagnosis not present

## 2018-08-17 MED ORDER — CIPROFLOXACIN-HYDROCORTISONE 0.2-1 % OT SUSP: 3.0000 [drp] | Freq: Two times a day (BID) | OTIC | 0 refills | Status: DC

## 2018-08-17 NOTE — Progress Notes (Signed)
E Visit for Swimmer's Ear  We are sorry that you are not feeling well. Here is how we plan to help!  I have prescribed: Ciprofloxin 0.2% and hydrocortisone 1% otic suspension 3 drops in affected ears twice daily for 7 days    In certain cases swimmer's ear may progress to a more serious bacterial infection of the middle or inner ear.  If you have a fever 102 and up and significantly worsening symptoms, this could indicate a more serious infection moving to the middle/inner and needs face to face evaluation in an office by a provider.  Your symptoms should improve over the next 3 days and should resolve in about 7 days.  HOME CARE:   Wash your hands frequently.  Do not place the tip of the bottle on your ear or touch it with your fingers.  You can take Acetominophen 650 mg every 4-6 hours as needed for pain.  If pain is severe or moderate, you can apply a heating pad (set on low) or hot water bottle (wrapped in a towel) to outer ear for 20 minutes.  This will also increase drainage.  Avoid ear plugs  Do not use Q-tips  After showers, help the water run out by tilting your head to one side.  GET HELP RIGHT AWAY IF:   Fever is over 102.2 degrees.  You develop progressive ear pain or hearing loss.  Ear symptoms persist longer than 3 days after treatment.  MAKE SURE YOU:   Understand these instructions.  Will watch your condition.  Will get help right away if you are not doing well or get worse.  TO PREVENT SWIMMER'S EAR:  Use a bathing cap or custom fitted swim molds to keep your ears dry.  Towel off after swimming to dry your ears.  Tilt your head or pull your earlobes to allow the water to escape your ear canal.  If there is still water in your ears, consider using a hairdryer on the lowest setting.  Thank you for choosing an e-visit. Your e-visit answers were reviewed by a board certified advanced clinical practitioner to complete your personal care plan.  Depending upon the condition, your plan could have included both over the counter or prescription medications. Please review your pharmacy choice. Be sure that the pharmacy you have chosen is open so that you can pick up your prescription now.  If there is a problem you may message your provider in [place] to have the prescription routed to another pharmacy. Your safety is important to [place]. If you have drug allergies check your prescription carefully.  For the next 24 hours, you can use MyChart to ask questions about today's visit, request a non-urgent call back, or ask for a work or school excuse from your e-visit provider. You will get an email in the next two days asking about your experience. I hope that your e-visit has been valuable and will speed your recovery.   5 minutes spent reviewing and documenting in chart.

## 2018-08-19 MED ORDER — OFLOXACIN 0.3 % OT SOLN: 5.0000 [drp] | Freq: Every day | OTIC | 0 refills | Status: DC

## 2018-08-19 MED ORDER — TOBRAMYCIN-DEXAMETHASONE 0.3-0.1 % OP SUSP: 2.0000 [drp] | Freq: Four times a day (QID) | OPHTHALMIC | 0 refills | Status: DC

## 2018-08-19 NOTE — Addendum Note (Signed)
Addended by: Chevis Pretty on: 08/19/2018 11:29 AM   Modules accepted: Orders

## 2018-08-19 NOTE — Progress Notes (Signed)
Addendum: Other was too expensive:   I have prescribed: Tobramycin 0.3% and dexamethasone 0.1% opthmalmic suspension two drops in affected ears four times a day for 7 days  Karen Sabin, DNP, FNP-BC [place] [facility]

## 2018-08-19 NOTE — Addendum Note (Signed)
Addended by: Benjamine Mola on: 08/19/2018 08:54 AM   Modules accepted: Orders

## 2018-08-22 ENCOUNTER — Encounter: Payer: Self-pay | Admitting: Family Medicine

## 2018-08-22 MED ORDER — AMOXICILLIN 500 MG PO CAPS: 1000.0000 mg | ORAL_CAPSULE | Freq: Two times a day (BID) | ORAL | 0 refills | Status: AC

## 2018-08-26 ENCOUNTER — Ambulatory Visit (INDEPENDENT_AMBULATORY_CARE_PROVIDER_SITE_OTHER): Payer: 59 | Admitting: Family Medicine

## 2018-08-26 ENCOUNTER — Encounter: Payer: Self-pay | Admitting: Family Medicine

## 2018-08-26 ENCOUNTER — Other Ambulatory Visit: Payer: Self-pay

## 2018-08-26 VITALS — BP 142/88 | HR 76 | Temp 98.0°F | Resp 16 | Wt 343.0 lb

## 2018-08-26 DIAGNOSIS — E119 Type 2 diabetes mellitus without complications: Secondary | ICD-10-CM

## 2018-08-26 DIAGNOSIS — E039 Hypothyroidism, unspecified: Secondary | ICD-10-CM | POA: Diagnosis not present

## 2018-08-26 DIAGNOSIS — I10 Essential (primary) hypertension: Secondary | ICD-10-CM | POA: Diagnosis not present

## 2018-08-26 DIAGNOSIS — E785 Hyperlipidemia, unspecified: Secondary | ICD-10-CM | POA: Diagnosis not present

## 2018-08-26 LAB — POCT GLYCOSYLATED HEMOGLOBIN (HGB A1C): Hemoglobin A1C: 7.8 % — AB (ref 4.0–5.6)

## 2018-08-26 NOTE — Progress Notes (Signed)
Patient: Karen Estrada Female    DOB: August 25, 1970   48 y.o.   MRN: [id number] Visit Date: 08/26/2018  Today's Provider: Lelon Huh, MD   Chief Complaint  Patient presents with  . Hypertension  . Diabetes   Subjective:     HPI    Diabetes Mellitus Type II, Follow-up:   Lab Results  Component Value Date   HGBA1C 9.4 (A) 07/15/2018   HGBA1C 7.7 (A) 03/11/2018   HGBA1C 8 10/29/2017   Last seen for diabetes 6 weeks ago.  Management since then includes Started Trulicity. She reports excellent compliance with treatment. She is not having side effect, but she did have nausea and vomiting that last about 2 days last weekend, at which she dime she was on antibiotic for ear infections and had just increase Trulicity to 1.5mg  Current symptoms include none and have been stable. Home blood sugar records: Pt is not checking her blood sugars at home. Wt Readings from Last 3 Encounters:  08/26/18 (!) 343 lb (155.6 kg)  07/15/18 (!) 350 lb (158.8 kg)  10/29/17 (!) 352 lb (159.7 kg)    Episodes of hypoglycemia? no   Current Insulin Regimen: 1.5mg  a week. Most Recent Eye Exam: Pt is due for an eye exam Weight trend: stable Prior visit with dietician: no Current diet: in general, a "healthy" diet   Current exercise: walking  ------------------------------------------------------------------------   Hypertension, follow-up:  BP Readings from Last 3 Encounters:  08/26/18 (!) 142/88  07/15/18 138/88  03/11/18 (!) 152/104    She was last seen for hypertension 6 weeks ago.  BP at that visit was 138/88. Management since that visit includes No changes, work on lifestyle changes She reports excellent compliance with treatment. She is not having side effects.  She is exercising. She is adherent to low salt diet.   Outside blood pressures are 120-130's/80-90's. She is experiencing none.  Patient denies chest pain, exertional chest pressure/discomfort, fatigue, lower  extremity edema and tachypnea.   Cardiovascular risk factors include diabetes mellitus, hypertension and obesity (BMI >= 30 kg/m2).  Use of agents associated with hypertension: none.   ------------------------------------------------------------------------    Allergies  Allergen Reactions  . Fluticasone     Caused nosebleeds  . Lovastatin     Elevated CK (1012)  . Pioglitazone Diarrhea     Current Outpatient Medications:  .  amoxicillin (AMOXIL) 500 MG capsule, Take 2 capsules (1,000 mg total) by mouth 2 (two) times daily for 10 days., Disp: 40 capsule, Rfl: 0 .  atorvastatin (LIPITOR) 20 MG tablet, TAKE 1 TABLET BY MOUTH ONCE DAILY, Disp: 90 tablet, Rfl: 1 .  Coenzyme Q10 (COQ10) 100 MG CAPS, Take 1 tablet by mouth daily., Disp: , Rfl:  .  Dulaglutide (TRULICITY) 1.5 ME/2.6ST SOPN, Inject 1.5 mg into the skin once a week., Disp: 1 pen, Rfl: 0 .  HYDROcodone-acetaminophen (NORCO/VICODIN) 5-325 MG tablet, Take 1 tablet by mouth every 6 (six) hours as needed for moderate pain., Disp: 30 tablet, Rfl: 0 .  levothyroxine (SYNTHROID, LEVOTHROID) 200 MCG tablet, TAKE 1 TABLET BY MOUTH ONCE DAILY BEFORE BREAKFAST, Disp: 30 tablet, Rfl: 1 .  lisinopril (PRINIVIL,ZESTRIL) 10 MG tablet, Take 1 tablet (10 mg total) by mouth daily., Disp: 30 tablet, Rfl: 5 .  metFORMIN (GLUCOPHAGE) 1000 MG tablet, TAKE 1/2 (ONE-HALF) TABLET BY MOUTH IN THE MORNING AND 1 IN THE EVENING, Disp: 135 tablet, Rfl: 2 .  Milk Thistle 140 MG CAPS, , Disp: , Rfl:  .  naproxen (NAPROSYN) 500 MG tablet, Take 1 tablet (500 mg total) by mouth 2 (two) times daily as needed., Disp: 180 tablet, Rfl: 1 .  omeprazole (PRILOSEC) 20 MG capsule, Take by mouth., Disp: , Rfl:  .  ondansetron (ZOFRAN) 4 MG tablet, Take 1 tablet (4 mg total) by mouth every 8 (eight) hours as needed. for nausea, Disp: 10 tablet, Rfl: 3 .  promethazine (PHENERGAN) 25 MG tablet, TAKE 1 TABLET BY MOUTH EVERY 6 HOURS AS NEEDED, Disp: 30 tablet, Rfl: 3 .   sertraline (ZOLOFT) 50 MG tablet, Take 1 tablet (50 mg total) by mouth daily., Disp: 30 tablet, Rfl: 12 .  triamcinolone cream (KENALOG) 0.5 %, APPLY CREAM TOPICALLY TO AFFECTED AREA TWICE DAILY, AS DIRECTED, Disp: 30 g, Rfl: 5 .  valACYclovir (VALTREX) 1000 MG tablet, 2 tablets onset of cold sore, take 2 more tablets twelve hours later., Disp: 24 tablet, Rfl: 1 .  Vitamin D, Ergocalciferol, (DRISDOL) 50000 units CAPS capsule, TAKE 1 CAPSULE BY MOUTH ONCE A WEEK, Disp: 12 capsule, Rfl: 4 .  zolpidem (AMBIEN) 10 MG tablet, Take 1 tablet (10 mg total) by mouth at bedtime as needed. for sleep, Disp: 30 tablet, Rfl: 5 .  ciprofloxacin-hydrocortisone (CIPRO HC) OTIC suspension, Place 3 drops into the right ear 2 (two) times daily. (Patient not taking: Reported on 08/26/2018), Disp: 10 mL, Rfl: 0 .  naproxen (NAPROSYN) 500 MG tablet, TAKE 1 TABLET BY MOUTH TWICE DAILY AS NEEDED, Disp: 90 tablet, Rfl: 4 .  ofloxacin (FLOXIN OTIC) 0.3 % OTIC solution, Place 5 drops into both ears daily. (Patient not taking: Reported on 08/26/2018), Disp: 10 mL, Rfl: 0 .  tobramycin-dexamethasone (TOBRADEX) ophthalmic solution, Place 2 drops into both ears every 6 (six) hours. In affected ear for 7 days (Patient not taking: Reported on 08/26/2018), Disp: 5 mL, Rfl: 0  Review of Systems  Constitutional: Negative.   Respiratory: Negative.   Cardiovascular: Negative.   Gastrointestinal: Positive for nausea and vomiting.  Endocrine: Negative.   Neurological: Negative for dizziness, light-headedness and headaches.    Social History   Tobacco Use  . Smoking status: Former Smoker    Packs/day: 0.75    Years: 9.00    Pack years: 6.75    Types: Cigarettes    Last attempt to quit: 04/06/2016    Years since quitting: 2.3  . Smokeless tobacco: Never Used  Substance Use Topics  . Alcohol use: No    Alcohol/week: 0.0 standard drinks      Objective:   BP (!) 142/88 (BP Location: Left Arm, Patient Position: Sitting, Cuff  Size: Large)   Pulse 76   Temp 98 F (36.7 C) (Oral)   Resp 16   Wt (!) 343 lb (155.6 kg)   BMI 60.76 kg/m  Vitals:   08/26/18 1610  BP: (!) 142/88  Pulse: 76  Resp: 16  Temp: 98 F (36.7 C)  TempSrc: Oral  Weight: (!) 343 lb (155.6 kg)     Physical Exam  General Appearance:    Alert, cooperative, no distress, obese  Eyes:    PERRL, conjunctiva/corneas clear, EOM's intact       Lungs:     Clear to auscultation bilaterally, respirations unlabored  Heart:    Regular rate and rhythm  Neurologic:   Awake, alert, oriented x 3. No apparent focal neurological           defect.       Results for orders placed or performed in visit on 08/26/18  POCT glycosylated hemoglobin (Hb A1C)  Result Value Ref Range   Hemoglobin A1C 7.8 (A) 4.0 - 5.6 %       Assessment & Plan    1. Diabetes mellitus with coincident hypertension ([place]) Doing much better with initiation of Trulicity which she is tolerating well. Continue current medications.  She does not check sugars and has never been to diabetic education which she is interested in.  - Amb ref to Medical Nutrition Therapy-MNT - Comprehensive metabolic panel - Lipid panel - POCT glycosylated hemoglobin (Hb A1C)  2. Hypothyroidism, unspecified type  - TSH  3. Hyperlipidemia, unspecified hyperlipidemia type She is tolerating atorvastatin well with no adverse effects.   - Comprehensive metabolic panel - Lipid panel     Lelon Huh, MD  [facility]

## 2018-08-26 NOTE — Patient Instructions (Addendum)
.   Please review the attached list of medications and notify my office if there are any errors.   . Please bring all of your medications to every appointment so we can make sure that our medication list is the same as yours.   

## 2018-09-13 ENCOUNTER — Other Ambulatory Visit: Payer: Self-pay | Admitting: Family Medicine

## 2018-09-13 DIAGNOSIS — N946 Dysmenorrhea, unspecified: Secondary | ICD-10-CM

## 2018-09-13 NOTE — Telephone Encounter (Signed)
Please review

## 2018-09-14 MED ORDER — HYDROCODONE-ACETAMINOPHEN 5-325 MG PO TABS: 1.0000 | ORAL_TABLET | Freq: Four times a day (QID) | ORAL | 0 refills | Status: DC | PRN

## 2018-09-24 ENCOUNTER — Other Ambulatory Visit: Payer: Self-pay | Admitting: Family Medicine

## 2018-09-24 DIAGNOSIS — I10 Essential (primary) hypertension: Principal | ICD-10-CM

## 2018-09-24 DIAGNOSIS — E119 Type 2 diabetes mellitus without complications: Secondary | ICD-10-CM

## 2018-09-26 MED ORDER — DULAGLUTIDE 1.5 MG/0.5ML ~~LOC~~ SOAJ: 1.5000 mg | SUBCUTANEOUS | 3 refills | Status: DC

## 2018-09-29 ENCOUNTER — Encounter: Payer: Self-pay | Admitting: Family Medicine

## 2018-09-29 DIAGNOSIS — E119 Type 2 diabetes mellitus without complications: Secondary | ICD-10-CM

## 2018-09-29 DIAGNOSIS — I10 Essential (primary) hypertension: Principal | ICD-10-CM

## 2018-09-29 MED ORDER — DULAGLUTIDE 1.5 MG/0.5ML ~~LOC~~ SOAJ: 1.5000 mg | SUBCUTANEOUS | 12 refills | Status: DC

## 2018-09-30 ENCOUNTER — Encounter: Payer: 59 | Attending: Family Medicine | Admitting: *Deleted

## 2018-09-30 ENCOUNTER — Other Ambulatory Visit: Payer: Self-pay

## 2018-09-30 ENCOUNTER — Encounter: Payer: Self-pay | Admitting: Family Medicine

## 2018-09-30 ENCOUNTER — Encounter: Payer: Self-pay | Admitting: *Deleted

## 2018-09-30 VITALS — BP 140/90 | Ht 63.0 in | Wt 336.0 lb

## 2018-09-30 DIAGNOSIS — I10 Essential (primary) hypertension: Secondary | ICD-10-CM | POA: Insufficient documentation

## 2018-09-30 DIAGNOSIS — E119 Type 2 diabetes mellitus without complications: Secondary | ICD-10-CM | POA: Diagnosis not present

## 2018-09-30 NOTE — Patient Instructions (Signed)
Check blood sugars before breakfast and 2 hrs after one meal 3-4 x week Bring blood sugar records to the next class  Call your doctor for a prescription for:  1. Meter strips (type) One Touch Verio checking  3-4  times per week  2. Lancets (type) One Touch Delica checking  3-4   times per week  Exercise: Continue walking  for  30 minutes  5 days a week  Eat 3 meals day, 1-2  snacks a day Space meals 4-6 hours apart Don't skip meals Avoid sugar sweetened drinks (soda, tea, juices)  Make an eye doctor appointment  Return for classes on:

## 2018-09-30 NOTE — Progress Notes (Signed)
Diabetes Self-Management Education  Visit Type: First/Initial  Appt. Start Time: 1400 Appt. End Time: 7253  09/30/2018  [name], identified by name and date of birth, is a 48 y.o. female with a diagnosis of Diabetes: Type 2.   ASSESSMENT  Blood pressure 140/90, height 5\' 3"  (1.6 m), weight (!) 336 lb (152.4 kg). Body mass index is 59.52 kg/m.  Diabetes Self-Management Education - 09/30/18 1548      Visit Information   Visit Type  First/Initial      Initial Visit   Diabetes Type  Type 2    Are you currently following a meal plan?  Yes    What type of meal plan do you follow?  "eating less due to new meds"    Are you taking your medications as prescribed?  Yes    Date Diagnosed  "recently" - A1C from 2017 was 6.5 %      Health Coping   How would you rate your overall health?  Fair      Psychosocial Assessment   Patient Belief/Attitude about Diabetes  Other (comment)   "something I have to deal with"   Self-care barriers  None    Self-management support  Doctor's office;Friends;Family    Patient Concerns  Nutrition/Meal planning;Glycemic Control;Monitoring;Weight Control;Healthy Lifestyle;Other (comment)   "spend as much time as I can with kids and grandkids"   Special Needs  None    Preferred Learning Style  Visual;Hands on    Learning Readiness  Ready    How often do you need to have someone help you when you read instructions, pamphlets, or other written materials from your doctor or pharmacy?  1 - Never    What is the last grade level you completed in school?  high school      Pre-Education Assessment   Patient understands the diabetes disease and treatment process.  Needs Instruction    Patient understands incorporating nutritional management into lifestyle.  Needs Instruction    Patient undertands incorporating physical activity into lifestyle.  Needs Review    Patient understands using medications safely.  Needs Instruction    Patient understands  monitoring blood glucose, interpreting and using results  Needs Instruction    Patient understands prevention, detection, and treatment of acute complications.  Needs Instruction    Patient understands prevention, detection, and treatment of chronic complications.  Needs Instruction    Patient understands how to develop strategies to address psychosocial issues.  Needs Instruction    Patient understands how to develop strategies to promote health/change behavior.  Needs Instruction      Complications   Last HgB A1C per patient/outside source  7.8 %   08/26/2018   How often do you check your blood sugar?  0 times/day (not testing)   Provided One Touch Verio Flex meter and instructed on use. BG upon return demonstration was 112 mg/dL at 3:00 pm - fasting. (pt works 3rd shift)   Have you had a dilated eye exam in the past 12 months?  No    Have you had a dental exam in the past 12 months?  No    Are you checking your feet?  No      Dietary Intake   Breakfast  works 3rd shift - eats out for many meals    Lunch  11 am - susage biscuit, sandwich, left overs    Dinner  9 pm - beef, chicken bread, potatoes, pasta, dumplings, fried oka, lettuce, cuccumbers, broccoli, squash, okra, onions,  peppers    Snack (evening)  3 am - lunchable     Beverage(s)  water, fruit juice, regular sodas, sugar sweetened tea, diet soda      Exercise   Exercise Type  Light (walking / raking leaves)    How many days per week to you exercise?  5    How many minutes per day do you exercise?  30    Total minutes per week of exercise  150      Patient Education   Previous Diabetes Education  No    Disease state   Definition of diabetes, type 1 and 2, and the diagnosis of diabetes;Factors that contribute to the development of diabetes    Nutrition management   Role of diet in the treatment of diabetes and the relationship between the three main macronutrients and blood glucose level;Reviewed blood glucose goals for pre and  post meals and how to evaluate the patients' food intake on their blood glucose level.    Physical activity and exercise   Role of exercise on diabetes management, blood pressure control and cardiac health.    Medications  Taught/reviewed Trulicity injection, site rotation, insulin storage and needle disposal.;Reviewed patients medication for diabetes, action, purpose, timing of dose and side effects.    Monitoring  Taught/evaluated SMBG meter.;Purpose and frequency of SMBG.;Taught/discussed recording of test results and interpretation of SMBG.;Identified appropriate SMBG and/or A1C goals.    Chronic complications  Relationship between chronic complications and blood glucose control;Retinopathy and reason for yearly dilated eye exams    Psychosocial adjustment  Identified and addressed patients feelings and concerns about diabetes      Individualized Goals (developed by patient)   Reducing Risk Improve blood sugars Prevent diabetes complications Lose weight Lead a healthier lifestyle Spend time with family     Outcomes   Expected Outcomes  Demonstrated interest in learning. Expect positive outcomes    Future DMSE  4-6 wks       Individualized Plan for Diabetes Self-Management Training:   Learning Objective:  Patient will have a greater understanding of diabetes self-management. Patient education plan is to attend individual and/or group sessions per assessed needs and concerns.   Plan:   Patient Instructions  Check blood sugars before breakfast and 2 hrs after one meal 3-4 x week Bring blood sugar records to the next class Call your doctor for a prescription for:  1. Meter strips (type) One Touch Verio checking  3-4  times per week  2. Lancets (type) One Touch Delica checking  3-4   times per week  Exercise: Continue walking  for  30 minutes  5 days a week Eat 3 meals day, 1-2  snacks a day Space meals 4-6 hours apart Don't skip meals Avoid sugar sweetened drinks (soda, tea,  juices) Make an eye doctor appointment  Expected Outcomes:  Demonstrated interest in learning. Expect positive outcomes  Education material provided:  General Meal Planning Guidelines Simple Meal Plan Meter = One Touch Verio Flex  If problems or questions, patient to contact team via:  Karen Drilling, RN, CCM, CDE [phone]   Future DSME appointment: 4-6 wks  Oct 24, 2018 for Diabetes Class 1

## 2018-10-02 ENCOUNTER — Encounter: Payer: Self-pay | Admitting: Family Medicine

## 2018-10-03 ENCOUNTER — Telehealth: Payer: Self-pay | Admitting: Family Medicine

## 2018-10-03 DIAGNOSIS — I10 Essential (primary) hypertension: Principal | ICD-10-CM

## 2018-10-03 DIAGNOSIS — E119 Type 2 diabetes mellitus without complications: Secondary | ICD-10-CM

## 2018-10-03 MED ORDER — GLUCOSE BLOOD VI STRP: ORAL_STRIP | 4 refills | Status: DC

## 2018-10-03 MED ORDER — ONETOUCH DELICA LANCING DEV MISC: 4 refills | Status: DC

## 2018-10-03 NOTE — Telephone Encounter (Signed)
Can we save a couple samples of Trulicity 1.5 mg for [name]. She can have 2 boxex (4 pens).  Thanks.   ---------------------------------------------------------------- Me  to Karen Estrada "Karen Estrada"        11:53 AM  We've got plenty of samples of Trulicity 1.5mg . Just call up here when you want to pick them up and one of our Medical Assistants will bring them down to your call.   Dr. Caryn Section    This MyChart message has not been read.        10:56 AM  Randal Buba, CMA routed this conversation to Me        8:57 AM  Doristine Devoid, CMA routed this conversation to [facility] Nurse  October 02, 2018  Karen James A "Karen Estrada"  to Me        12:38 AM  I went to pick up prescription of Trulicity and they wanted $339 with my insurance and [employer] discount for a month supply.    ----------------------------------------------------------

## 2018-10-04 MED ORDER — DULAGLUTIDE 1.5 MG/0.5ML ~~LOC~~ SOAJ: 1.5000 mg | SUBCUTANEOUS | 0 refills | Status: DC

## 2018-10-05 ENCOUNTER — Other Ambulatory Visit: Payer: Self-pay | Admitting: Physician Assistant

## 2018-10-05 ENCOUNTER — Other Ambulatory Visit: Payer: Self-pay | Admitting: Family Medicine

## 2018-10-05 DIAGNOSIS — E039 Hypothyroidism, unspecified: Secondary | ICD-10-CM

## 2018-10-05 NOTE — Telephone Encounter (Signed)
Please advise is refill is appropriate.

## 2018-10-06 NOTE — Telephone Encounter (Signed)
Please Review

## 2018-10-06 NOTE — Telephone Encounter (Signed)
It looks like TSH has not been checked in >1 year.  She should schedule a visit prior to next refill

## 2018-10-14 ENCOUNTER — Other Ambulatory Visit: Payer: Self-pay | Admitting: Family Medicine

## 2018-10-14 DIAGNOSIS — N946 Dysmenorrhea, unspecified: Secondary | ICD-10-CM

## 2018-10-14 DIAGNOSIS — E039 Hypothyroidism, unspecified: Secondary | ICD-10-CM

## 2018-10-15 ENCOUNTER — Encounter: Payer: Self-pay | Admitting: Family Medicine

## 2018-10-17 MED ORDER — HYDROCODONE-ACETAMINOPHEN 5-325 MG PO TABS: 1.0000 | ORAL_TABLET | Freq: Four times a day (QID) | ORAL | 0 refills | Status: DC | PRN

## 2018-10-17 MED ORDER — ONDANSETRON HCL 4 MG PO TABS: 4.0000 mg | ORAL_TABLET | Freq: Three times a day (TID) | ORAL | 3 refills | Status: DC | PRN

## 2018-10-17 MED ORDER — LEVOTHYROXINE SODIUM 200 MCG PO TABS: ORAL_TABLET | ORAL | 1 refills | Status: DC

## 2018-10-17 MED ORDER — SERTRALINE HCL 50 MG PO TABS: 50.0000 mg | ORAL_TABLET | Freq: Every day | ORAL | 12 refills | Status: DC

## 2018-10-17 NOTE — Telephone Encounter (Signed)
Please Review

## 2018-10-24 ENCOUNTER — Encounter: Payer: Self-pay | Admitting: Dietician

## 2018-10-24 ENCOUNTER — Encounter: Payer: 59 | Attending: Family Medicine

## 2018-10-24 ENCOUNTER — Other Ambulatory Visit: Payer: Self-pay

## 2018-10-24 DIAGNOSIS — I10 Essential (primary) hypertension: Secondary | ICD-10-CM | POA: Insufficient documentation

## 2018-10-24 DIAGNOSIS — E119 Type 2 diabetes mellitus without complications: Secondary | ICD-10-CM | POA: Insufficient documentation

## 2018-10-24 NOTE — Progress Notes (Signed)
Patient did not come to class 1 today 10/24/18. Will contact patient by phone to reschedule.

## 2018-10-27 ENCOUNTER — Encounter: Payer: Self-pay | Admitting: Family Medicine

## 2018-10-28 ENCOUNTER — Telehealth: Payer: Self-pay | Admitting: Dietician

## 2018-10-28 NOTE — Telephone Encounter (Signed)
Called patient to reschedule her class series, as she missed class 1 on 10/24/18. Left a voicemail message with dates of next class series and requested a call back.

## 2018-10-31 ENCOUNTER — Ambulatory Visit: Payer: 59

## 2018-11-01 ENCOUNTER — Telehealth: Payer: Self-pay | Admitting: Family Medicine

## 2018-11-01 NOTE — Telephone Encounter (Signed)
Please call patient regarding request for trulilicity samples. It looks like we are out of the 1.5mg  pens, but she can have a box of the 0.75 Did she have trouble filling the prescription that was sent last month?

## 2018-11-01 NOTE — Telephone Encounter (Signed)
See telephone message

## 2018-11-02 ENCOUNTER — Encounter: Payer: Self-pay | Admitting: Family Medicine

## 2018-11-02 NOTE — Telephone Encounter (Signed)
Tried calling patient. Left message to call back. 

## 2018-11-03 NOTE — Telephone Encounter (Signed)
Patient called and stated she could not afford the Trulilicity due to it been $900 with her insurance and with her $389.00 for 4 with insurance and a discount. Patient was advised she could get a box of the 0.75. FYI

## 2018-11-14 ENCOUNTER — Ambulatory Visit: Payer: 59

## 2018-11-16 ENCOUNTER — Encounter: Payer: Self-pay | Admitting: Family Medicine

## 2018-11-16 ENCOUNTER — Other Ambulatory Visit: Payer: Self-pay | Admitting: Family Medicine

## 2018-11-16 DIAGNOSIS — N946 Dysmenorrhea, unspecified: Secondary | ICD-10-CM

## 2018-11-16 MED ORDER — HYDROCODONE-ACETAMINOPHEN 5-325 MG PO TABS: 1.0000 | ORAL_TABLET | Freq: Four times a day (QID) | ORAL | 0 refills | Status: DC | PRN

## 2018-11-16 NOTE — Telephone Encounter (Signed)
I called and spoke with patient to advise her that we don't have any samples at this time. Patient is completely out of this medication. She plans to call back on Friday to see if we received any more samples.

## 2018-11-21 ENCOUNTER — Ambulatory Visit: Payer: Self-pay | Admitting: Family Medicine

## 2018-11-23 ENCOUNTER — Telehealth: Payer: Self-pay | Admitting: Family Medicine

## 2018-11-23 ENCOUNTER — Encounter: Payer: Self-pay | Admitting: Family Medicine

## 2018-11-23 NOTE — Telephone Encounter (Signed)
Please advise patient we've got samples of Trulicity 1.5. I placed two boxes in a paper bag in the fridge for her.

## 2018-11-23 NOTE — Telephone Encounter (Signed)
Pt advised.   Thanks,   -Laura  

## 2018-11-23 NOTE — Telephone Encounter (Signed)
-----   Message from Birdie Sons, MD sent at 11/15/2018 12:31 PM EDT ----- Regarding: FW: get more samples of trulicity 1.5 end of May Sent message to carol 11-15-2018 ----- Message ----- From: Birdie Sons, MD Sent: 11/15/2018 To: Birdie Sons, MD Subject: FW: get more samples of trulicity 1.5 end of#   ----- Message ----- From: Birdie Sons, MD Sent: 11/14/2018 To: Birdie Sons, MD Subject: get more samples of trulicity 1.5 end of May

## 2018-11-28 ENCOUNTER — Encounter: Payer: Self-pay | Admitting: *Deleted

## 2018-12-14 ENCOUNTER — Other Ambulatory Visit: Payer: Self-pay | Admitting: Family Medicine

## 2018-12-14 DIAGNOSIS — E785 Hyperlipidemia, unspecified: Secondary | ICD-10-CM

## 2018-12-21 ENCOUNTER — Other Ambulatory Visit: Payer: Self-pay | Admitting: Family Medicine

## 2018-12-21 DIAGNOSIS — N946 Dysmenorrhea, unspecified: Secondary | ICD-10-CM

## 2018-12-21 MED ORDER — HYDROCODONE-ACETAMINOPHEN 5-325 MG PO TABS: 1.0000 | ORAL_TABLET | Freq: Four times a day (QID) | ORAL | 0 refills | Status: DC | PRN

## 2018-12-23 ENCOUNTER — Ambulatory Visit (INDEPENDENT_AMBULATORY_CARE_PROVIDER_SITE_OTHER): Payer: 59 | Admitting: Family Medicine

## 2018-12-23 ENCOUNTER — Encounter: Payer: Self-pay | Admitting: Family Medicine

## 2018-12-23 ENCOUNTER — Other Ambulatory Visit: Payer: Self-pay

## 2018-12-23 VITALS — BP 124/82 | HR 80 | Temp 99.0°F | Resp 16 | Wt 335.0 lb

## 2018-12-23 DIAGNOSIS — I10 Essential (primary) hypertension: Secondary | ICD-10-CM

## 2018-12-23 DIAGNOSIS — E119 Type 2 diabetes mellitus without complications: Secondary | ICD-10-CM | POA: Diagnosis not present

## 2018-12-23 DIAGNOSIS — E785 Hyperlipidemia, unspecified: Secondary | ICD-10-CM | POA: Diagnosis not present

## 2018-12-23 LAB — POCT GLYCOSYLATED HEMOGLOBIN (HGB A1C): Hemoglobin A1C: 6.1 % — AB (ref 4.0–5.6)

## 2018-12-23 NOTE — Progress Notes (Signed)
Patient: Karen Estrada Female    DOB: 02-16-1971   48 y.o.   MRN: [id number] Visit Date: 12/23/2018  Today's Provider: Lelon Huh, MD   Chief Complaint  Patient presents with  . Hyperlipidemia  . Hypertension  . Diabetes   Subjective:     HPI    Diabetes Mellitus Type II, Follow-up:   Lab Results  Component Value Date   HGBA1C 7.8 (A) 08/26/2018   HGBA1C 9.4 (A) 07/15/2018   HGBA1C 7.7 (A) 03/11/2018   Last seen for diabetes 4 months ago.  Management since then includes No changes. She reports excellent compliance with treatment. She is having side effects. Pt states she has trouble with nausea occasional vomiting with Trulicity.  She states she has to take zofran every 8 hours for two days.  Current symptoms include none and have been stable. Home blood sugar records: Pt says she only checks her sugars occasionally and it is around 130.  Episodes of hypoglycemia? no   Current Insulin Regimen: Trulicity 1.5mg  once a week. Most Recent Eye Exam: Pt is due for an eye exam. Weight trend: stable Prior visit with dietician: yes  Current diet: in general, a "healthy" diet   Current exercise: walking  ------------------------------------------------------------------------   Hypertension, follow-up:  BP Readings from Last 3 Encounters:  12/23/18 124/82  09/30/18 140/90  08/26/18 (!) 142/88    She was last seen for hypertension 4 months ago.  BP at that visit was 140/90. Management since that visit includes no changes She reports excellent compliance with treatment. She is not having side effects.  She is exercising. She is adherent to low salt diet.   Outside blood pressures are 150/90's. She is experiencing none.  Patient denies chest pain, lower extremity edema and palpitations.   Cardiovascular risk factors include diabetes mellitus, dyslipidemia, hypertension and obesity (BMI >= 30 kg/m2).  Use of agents associated with hypertension: none.   ------------------------------------------------------------------------    Lipid/Cholesterol, Follow-up:   Last seen for this 4 months ago.  Management since that visit includes Pt had labs order, but she had not got them done yet.  Last Lipid Panel:    Component Value Date/Time   CHOL 162 03/25/2018 1544   TRIG 184 (H) 03/25/2018 1544   HDL 38 (L) 03/25/2018 1544   CHOLHDL 4.3 03/25/2018 1544   LDLCALC 87 03/25/2018 1544    She reports excellent compliance with treatment. She is not having side effects.   Wt Readings from Last 3 Encounters:  12/23/18 (!) 335 lb (152 kg)  09/30/18 (!) 336 lb (152.4 kg)  08/26/18 (!) 343 lb (155.6 kg)    ------------------------------------------------------------------------    Allergies  Allergen Reactions  . Fluticasone     Caused nosebleeds  . Lovastatin     Elevated CK (1012)  . Pioglitazone Diarrhea     Current Outpatient Medications:  .  atorvastatin (LIPITOR) 20 MG tablet, Take 1 tablet by mouth once daily, Disp: 90 tablet, Rfl: 4 .  Coenzyme Q10 (COQ10) 100 MG CAPS, Take 1 tablet by mouth daily., Disp: , Rfl:  .  Dulaglutide (TRULICITY) 1.5 TI/4.5YK SOPN, Inject 1.5 mg into the skin once a week., Disp: 4 pen, Rfl: 0 .  glucose blood (ONETOUCH VERIO) test strip, Use as instructed to check sugar three times daily for type 2 diabetes E11.9, Disp: 100 each, Rfl: 4 .  HYDROcodone-acetaminophen (NORCO/VICODIN) 5-325 MG tablet, Take 1 tablet by mouth every 6 (six) hours as needed for  moderate pain., Disp: 30 tablet, Rfl: 0 .  Lancet Devices (ONE TOUCH DELICA LANCING DEV) MISC, Use to check sugar three times daily for type 2 diabetes E11.9, Disp: 100 each, Rfl: 4 .  levothyroxine (SYNTHROID) 200 MCG tablet, TAKE 1 TABLET BY MOUTH ONCE DAILY BEFORE BREAKFAST, Disp: 90 tablet, Rfl: 1 .  lisinopril (PRINIVIL,ZESTRIL) 10 MG tablet, Take 1 tablet (10 mg total) by mouth daily., Disp: 30 tablet, Rfl: 5 .  metFORMIN (GLUCOPHAGE) 1000 MG  tablet, TAKE 1/2 (ONE-HALF) TABLET BY MOUTH IN THE MORNING AND 1 IN THE EVENING, Disp: 135 tablet, Rfl: 4 .  Milk Thistle 140 MG CAPS, Take 1 capsule by mouth daily. , Disp: , Rfl:  .  naproxen (NAPROSYN) 500 MG tablet, TAKE 1 TABLET BY MOUTH TWICE DAILY AS NEEDED, Disp: 90 tablet, Rfl: 4 .  omeprazole (PRILOSEC) 20 MG capsule, Take 20 mg by mouth daily. , Disp: , Rfl:  .  ondansetron (ZOFRAN) 4 MG tablet, Take 1 tablet (4 mg total) by mouth every 8 (eight) hours as needed. for nausea, Disp: 10 tablet, Rfl: 3 .  promethazine (PHENERGAN) 25 MG tablet, TAKE 1 TABLET BY MOUTH EVERY 6 HOURS AS NEEDED, Disp: 30 tablet, Rfl: 2 .  sertraline (ZOLOFT) 50 MG tablet, Take 1 tablet (50 mg total) by mouth daily., Disp: 30 tablet, Rfl: 12 .  triamcinolone cream (KENALOG) 0.5 %, APPLY CREAM TOPICALLY TO AFFECTED AREA TWICE DAILY, AS DIRECTED, Disp: 30 g, Rfl: 5 .  valACYclovir (VALTREX) 1000 MG tablet, 2 tablets onset of cold sore, take 2 more tablets twelve hours later., Disp: 24 tablet, Rfl: 1 .  Vitamin D, Ergocalciferol, (DRISDOL) 50000 units CAPS capsule, TAKE 1 CAPSULE BY MOUTH ONCE A WEEK, Disp: 12 capsule, Rfl: 4 .  zolpidem (AMBIEN) 10 MG tablet, Take 1 tablet (10 mg total) by mouth at bedtime as needed. for sleep, Disp: 30 tablet, Rfl: 5  Review of Systems  Constitutional: Negative.   Respiratory: Negative.   Cardiovascular: Negative.   Gastrointestinal: Positive for nausea. Negative for abdominal distention, abdominal pain, anal bleeding, blood in stool, constipation, diarrhea, rectal pain and vomiting.  Neurological: Negative for dizziness, light-headedness and headaches.    Social History   Tobacco Use  . Smoking status: Former Smoker    Packs/day: 0.75    Years: 9.00    Pack years: 6.75    Types: Cigarettes    Quit date: 04/06/2016    Years since quitting: 2.7  . Smokeless tobacco: Never Used  Substance Use Topics  . Alcohol use: No    Alcohol/week: 0.0 standard drinks       Objective:   BP 124/82 (BP Location: Right Arm, Patient Position: Sitting, Cuff Size: Large)   Pulse 80   Temp 99 F (37.2 C) (Oral)   Resp 16   Wt (!) 335 lb (152 kg)   BMI 59.34 kg/m  Vitals:   12/23/18 1617  BP: 124/82  Pulse: 80  Resp: 16  Temp: 99 F (37.2 C)  TempSrc: Oral  Weight: (!) 335 lb (152 kg)     Physical Exam  General appearance: alert, well developed, well nourished, cooperative and in no distress Head: Normocephalic, without obvious abnormality, atraumatic Respiratory: Respirations even and unlabored, normal respiratory rate Extremities: No gross deformities Skin: Skin color, texture, turgor normal. No rashes seen  Psych: Appropriate mood and affect. Neurologic: Mental status: Alert, oriented to person, place, and time, thought content appropriate.  Results for orders placed or performed in visit on  12/23/18  POCT glycosylated hemoglobin (Hb A1C)  Result Value Ref Range   Hemoglobin A1C 6.1 (A) 4.0 - 5.6 %       Assessment & Plan    1. Essential hypertension Well controlled.  Continue current medications.    2. Diabetes mellitus with coincident hypertension ([place]) Doing much better on Trulicity. Have some mild nausea, but tolerable. Given a sample box of 2 pens today.   3. Hyperlipidemia, unspecified hyperlipidemia type She is tolerating atorvastatin well with no adverse effects.    Future Appointments  Date Time Provider [place]  04/28/2019  4:00 PM Caryn Section Kirstie Peri, MD [facility] None      The entirety of the information documented in the History of Present Illness, Review of Systems and Physical Exam were personally obtained by me. Portions of this information were initially documented by Ashley Royalty, CMA and reviewed by me for thoroughness and accuracy.   The entirety of the information documented in the History of Present Illness, Review of Systems and Physical Exam were personally obtained by me. Portions of this information were  initially documented by Ashley Royalty, CMA and reviewed by me for thoroughness and accuracy.      Lelon Huh, MD  [facility]

## 2018-12-23 NOTE — Patient Instructions (Addendum)
.   Please review the attached list of medications and notify my office if there are any errors.   . Please bring all of your medications to every appointment so we can make sure that our medication list is the same as yours.   . We will have flu vaccines available after Labor Day. Please go to your pharmacy or call the office in early September to schedule you flu shot.  . Please contact your eyecare professional to schedule a routine eye exam

## 2018-12-27 ENCOUNTER — Telehealth: Payer: Self-pay

## 2018-12-27 DIAGNOSIS — K760 Fatty (change of) liver, not elsewhere classified: Secondary | ICD-10-CM

## 2018-12-27 LAB — COMPREHENSIVE METABOLIC PANEL
ALT: 50 IU/L — ABNORMAL HIGH (ref 0–32)
AST: 61 IU/L — ABNORMAL HIGH (ref 0–40)
Albumin/Globulin Ratio: 1.1 — ABNORMAL LOW (ref 1.2–2.2)
Albumin: 3.8 g/dL (ref 3.8–4.8)
Alkaline Phosphatase: 152 IU/L — ABNORMAL HIGH (ref 39–117)
BUN / CREAT RATIO: 12 (ref 9–23)
BUN: 8 mg/dL (ref 6–24)
Bilirubin Total: 0.3 mg/dL (ref 0.0–1.2)
CO2: 24 mmol/L (ref 20–29)
Calcium: 8.9 mg/dL (ref 8.7–10.2)
Chloride: 100 mmol/L (ref 96–106)
Creatinine, Ser: 0.68 mg/dL (ref 0.57–1.00)
GFR calc Af Amer: 120 mL/min/{1.73_m2} (ref >59)
GFR calc non Af Amer: 104 mL/min/{1.73_m2} (ref >59)
Globulin, Total: 3.4 g/dL (ref 1.5–4.5)
Glucose: 115 mg/dL — ABNORMAL HIGH (ref 65–99)
Potassium: 4.1 mmol/L (ref 3.5–5.2)
SODIUM: 139 mmol/L (ref 134–144)
TOTAL PROTEIN: 7.2 g/dL (ref 6.0–8.5)

## 2018-12-27 LAB — LIPID PANEL
Chol/HDL Ratio: 3.4 ratio (ref 0.0–4.4)
Cholesterol, Total: 154 mg/dL (ref 100–199)
HDL: 45 mg/dL (ref >39)
LDL Calculated: 85 mg/dL (ref 0–99)
TRIGLYCERIDES: 121 mg/dL (ref 0–149)
VLDL Cholesterol Cal: 24 mg/dL (ref 5–40)

## 2018-12-27 LAB — TSH: TSH: 0.584 u[IU]/mL (ref 0.450–4.500)

## 2018-12-27 MED ORDER — PIOGLITAZONE HCL 30 MG PO TABS: 30.0000 mg | ORAL_TABLET | Freq: Every day | ORAL | 5 refills | Status: DC

## 2018-12-27 NOTE — Telephone Encounter (Signed)
Lab results given to patient along with changes in medication.

## 2018-12-27 NOTE — Telephone Encounter (Signed)
Left message for patient to call back regarding labs. 

## 2018-12-27 NOTE — Telephone Encounter (Signed)
-----   Message from Birdie Sons, MD sent at 12/27/2018 10:05 AM EDT ----- Liver functions are still elevated due to fatty liver. Need to start pioglitizone 30mg  once a day, #30 rf x 5. This is a diabetes medication which helps reduce fatty liver and helps prevent the development of cirrhosis. It does not cause hypoglycemia so long as she stop glipizide like we discussed.  She can also change to metformin to just one tablet in the evening, doesn't have to take the 1/2 tablet in the morning anymore  Otherwise labs are good. Follow up in November as scheduled.

## 2019-01-04 ENCOUNTER — Encounter: Payer: Self-pay | Admitting: Family Medicine

## 2019-01-05 NOTE — Telephone Encounter (Signed)
Do have these, or do you know if they are in Medical Records. I think I'll filled them out a few weeks ago.

## 2019-01-06 NOTE — Telephone Encounter (Signed)
I haven't seen any form, and Mickel Baas doesn't have anything on her desk. Helene Kelp did I ask you about these forms?

## 2019-01-16 ENCOUNTER — Encounter: Payer: Self-pay | Admitting: Family Medicine

## 2019-01-17 ENCOUNTER — Telehealth: Payer: Self-pay | Admitting: Family Medicine

## 2019-01-17 NOTE — Telephone Encounter (Signed)
I filled this out last month. Can you see if Medical Records has it. If not then patient may have to bring another copy of it.    ----- Message -----  From: Corine Shelter  Sent: 01/16/2019 11:55 AM EDT  To: [facility]  Subject: Non-Urgent Medical Question             I left a form at my last doctor's visit to be filled out with with blood work results. Would you mind filling that out too please?

## 2019-01-17 NOTE — Telephone Encounter (Signed)
Medical records does not have the form.  Left message for pt to call back.  Please advise her to bring another copy as below.    Thanks,   -Mickel Baas

## 2019-01-17 NOTE — Telephone Encounter (Signed)
Pt returned call and was asked to bring in another copy. Thanks TNP

## 2019-01-18 ENCOUNTER — Other Ambulatory Visit: Payer: Self-pay | Admitting: Family Medicine

## 2019-01-18 DIAGNOSIS — N946 Dysmenorrhea, unspecified: Secondary | ICD-10-CM

## 2019-01-19 MED ORDER — HYDROCODONE-ACETAMINOPHEN 5-325 MG PO TABS: 1.0000 | ORAL_TABLET | Freq: Four times a day (QID) | ORAL | 0 refills | Status: DC | PRN

## 2019-01-28 ENCOUNTER — Other Ambulatory Visit: Payer: Self-pay | Admitting: Family Medicine

## 2019-02-10 ENCOUNTER — Encounter: Payer: Self-pay | Admitting: Family Medicine

## 2019-02-10 ENCOUNTER — Telehealth: Payer: Self-pay | Admitting: Family Medicine

## 2019-02-10 NOTE — Telephone Encounter (Signed)
Patient sent mychart message requesting Trulicity samples. There are two boxes of the 1.5mg  that she can have that are in the fridge.

## 2019-02-10 NOTE — Telephone Encounter (Signed)
Pt advised.   Thanks,   -Min Tunnell  

## 2019-02-10 NOTE — Telephone Encounter (Signed)
See telephone message

## 2019-02-16 ENCOUNTER — Other Ambulatory Visit: Payer: Self-pay | Admitting: Family Medicine

## 2019-02-16 ENCOUNTER — Encounter: Payer: Self-pay | Admitting: Family Medicine

## 2019-02-16 DIAGNOSIS — N946 Dysmenorrhea, unspecified: Secondary | ICD-10-CM

## 2019-02-16 MED ORDER — HYDROCODONE-ACETAMINOPHEN 5-325 MG PO TABS: 1.0000 | ORAL_TABLET | Freq: Four times a day (QID) | ORAL | 0 refills | Status: DC | PRN

## 2019-02-16 MED ORDER — VITAMIN D (ERGOCALCIFEROL) 1.25 MG (50000 UNIT) PO CAPS: 50000.0000 [IU] | ORAL_CAPSULE | ORAL | 4 refills | Status: DC

## 2019-03-09 ENCOUNTER — Other Ambulatory Visit: Payer: Self-pay | Admitting: Family Medicine

## 2019-03-09 DIAGNOSIS — I10 Essential (primary) hypertension: Secondary | ICD-10-CM

## 2019-03-10 ENCOUNTER — Encounter: Payer: Self-pay | Admitting: Family Medicine

## 2019-03-14 ENCOUNTER — Encounter: Payer: Self-pay | Admitting: Family Medicine

## 2019-03-14 NOTE — Telephone Encounter (Signed)
There is one box in a paper bag with her name on it in the fridge that she can pick up.

## 2019-03-17 ENCOUNTER — Other Ambulatory Visit: Payer: Self-pay | Admitting: Family Medicine

## 2019-03-17 DIAGNOSIS — N946 Dysmenorrhea, unspecified: Secondary | ICD-10-CM

## 2019-03-19 MED ORDER — HYDROCODONE-ACETAMINOPHEN 5-325 MG PO TABS: 1.0000 | ORAL_TABLET | Freq: Four times a day (QID) | ORAL | 0 refills | Status: DC | PRN

## 2019-03-24 ENCOUNTER — Encounter: Payer: Self-pay | Admitting: Family Medicine

## 2019-03-29 ENCOUNTER — Encounter: Payer: Self-pay | Admitting: Family Medicine

## 2019-03-30 ENCOUNTER — Encounter: Payer: Self-pay | Admitting: Family Medicine

## 2019-04-05 ENCOUNTER — Encounter: Payer: Self-pay | Admitting: Family Medicine

## 2019-04-07 ENCOUNTER — Encounter: Payer: Self-pay | Admitting: Family Medicine

## 2019-04-12 ENCOUNTER — Encounter: Payer: Self-pay | Admitting: Family Medicine

## 2019-04-12 ENCOUNTER — Telehealth: Payer: Self-pay | Admitting: Family Medicine

## 2019-04-12 DIAGNOSIS — E119 Type 2 diabetes mellitus without complications: Secondary | ICD-10-CM

## 2019-04-12 MED ORDER — OZEMPIC (0.25 OR 0.5 MG/DOSE) 2 MG/1.5ML ~~LOC~~ SOPN: 0.5000 mg | PEN_INJECTOR | SUBCUTANEOUS | 0 refills | Status: DC

## 2019-04-12 NOTE — Telephone Encounter (Signed)
Patient has been getting samples of Trulicity because it is too expensive, but we haven't gotten samples for several weeks.  Recommend we change her to samples of Ozempic 0.5mg  once a week which works just the same way She can have 4 sample pens.

## 2019-04-12 NOTE — Telephone Encounter (Signed)
Karen Estrada, CMA routed conversation to You 1 hour ago (9:34 AM)    Karen James A "Gwen"  You 1 hour ago (9:32 AM)     Any samples of Trulicity available?

## 2019-04-13 ENCOUNTER — Other Ambulatory Visit: Payer: Self-pay | Admitting: Family Medicine

## 2019-04-14 NOTE — Telephone Encounter (Signed)
Patient advised and agrees to try samples of Ozempic. Samples placed in refrigerator for pick up.

## 2019-04-14 NOTE — Telephone Encounter (Signed)
LMTCB

## 2019-04-21 ENCOUNTER — Other Ambulatory Visit: Payer: Self-pay | Admitting: Family Medicine

## 2019-04-21 DIAGNOSIS — N946 Dysmenorrhea, unspecified: Secondary | ICD-10-CM

## 2019-04-21 MED ORDER — HYDROCODONE-ACETAMINOPHEN 5-325 MG PO TABS: 1.0000 | ORAL_TABLET | Freq: Four times a day (QID) | ORAL | 0 refills | Status: DC | PRN

## 2019-04-21 NOTE — Telephone Encounter (Signed)
Last OV 12/23/2018 and last RF 03/19/2019

## 2019-04-28 ENCOUNTER — Other Ambulatory Visit: Payer: Self-pay

## 2019-04-28 ENCOUNTER — Encounter: Payer: Self-pay | Admitting: Family Medicine

## 2019-04-28 ENCOUNTER — Ambulatory Visit (INDEPENDENT_AMBULATORY_CARE_PROVIDER_SITE_OTHER): Payer: 59 | Admitting: Family Medicine

## 2019-04-28 VITALS — BP 149/90 | HR 80 | Temp 96.8°F | Wt 334.0 lb

## 2019-04-28 DIAGNOSIS — I10 Essential (primary) hypertension: Secondary | ICD-10-CM

## 2019-04-28 DIAGNOSIS — K760 Fatty (change of) liver, not elsewhere classified: Secondary | ICD-10-CM | POA: Diagnosis not present

## 2019-04-28 DIAGNOSIS — Z23 Encounter for immunization: Secondary | ICD-10-CM

## 2019-04-28 DIAGNOSIS — E119 Type 2 diabetes mellitus without complications: Secondary | ICD-10-CM

## 2019-04-28 LAB — POCT GLYCOSYLATED HEMOGLOBIN (HGB A1C)
Est. average glucose Bld gHb Est-mCnc: 131
Hemoglobin A1C: 6.2 % — AB (ref 4.0–5.6)

## 2019-04-28 NOTE — Progress Notes (Signed)
Patient: Karen Estrada Female    DOB: 1970/07/22   48 y.o.   MRN: [id number] Visit Date: 04/28/2019  Today's Provider: Lelon Huh, MD   No chief complaint on file.  Subjective:     HPI  Diabetes Mellitus Type II, Follow-up:   Lab Results  Component Value Date   HGBA1C 6.1 (A) 12/23/2018   HGBA1C 7.8 (A) 08/26/2018   HGBA1C 9.4 (A) 07/15/2018    Last seen for diabetes 4 months ago.  Management since then includes starting pioglitizone 30mg  once a day, due to fatty liver. She was also advised to stop Glipizide and just take one tablet of Metformin in the evening. Patient was later changed from Trulicity to [place] samples due to availability. She reports good compliance with treatment. She is not having side effects.  Current symptoms include none  Home blood sugar records: being checked occassionally  Episodes of hypoglycemia? no   Current insulin regiment: Ozempic Most Recent Eye Exam: not UTD  Weight trend: stable Prior visit with dietician: Yes  Current exercise: walking Current diet habits: in general, a "healthy" diet    Pertinent Labs:    Component Value Date/Time   CHOL 154 12/26/2018 1434   TRIG 121 12/26/2018 1434   HDL 45 12/26/2018 1434   LDLCALC 85 12/26/2018 1434   CREATININE 0.68 12/26/2018 1434    Wt Readings from Last 3 Encounters:  12/23/18 (!) 335 lb (152 kg)  09/30/18 (!) 336 lb (152.4 kg)  08/26/18 (!) 343 lb (155.6 kg)    ------------------------------------------------------------------------  Hypertension, follow-up:  BP Readings from Last 3 Encounters:  12/23/18 124/82  09/30/18 140/90  08/26/18 (!) 142/88    She was last seen for hypertension 4 months ago.  BP at that visit was 124/82. Management since that visit includes no changes. She reports good compliance with treatment. She is not having side effects.  She is exercising. She is adherent to low salt diet.   Outside blood pressures are being checked at  home. She is experiencing none.  Patient denies chest pain, chest pressure/discomfort, irregular heart beat and lower extremity edema.   Cardiovascular risk factors include diabetes mellitus, dyslipidemia, hypertension and obesity (BMI >= 30 kg/m2).  Use of agents associated with hypertension: none.     Weight trend: stable Wt Readings from Last 3 Encounters:  12/23/18 (!) 335 lb (152 kg)  09/30/18 (!) 336 lb (152.4 kg)  08/26/18 (!) 343 lb (155.6 kg)    Current diet: in general, a "healthy" diet    ------------------------------------------------------------------------  Follow up for Chronic pain:  The patient was last seen for this more than 6 months ago. Changes made at last visit include none.  She reports good compliance with treatment. She feels that condition is Unchanged. She is not having side effects.   ------------------------------------------------------------------------------------  Follow up for Hypothyroidism:  The patient was last seen for this 4 months ago. Changes made at last visit include none.  She reports good compliance with treatment. Lab Results  Component Value Date   TSH 0.584 12/26/2018    ------------------------------------------------------------------------------------  Allergies  Allergen Reactions  . Fluticasone     Caused nosebleeds  . Lovastatin     Elevated CK (1012)  . Pioglitazone Diarrhea     Current Outpatient Medications:  .  atorvastatin (LIPITOR) 20 MG tablet, Take 1 tablet by mouth once daily, Disp: 90 tablet, Rfl: 4 .  Coenzyme Q10 (COQ10) 100 MG CAPS, Take 1 tablet by  mouth daily., Disp: , Rfl:  .  glucose blood (ONETOUCH VERIO) test strip, Use as instructed to check sugar three times daily for type 2 diabetes E11.9, Disp: 100 each, Rfl: 4 .  HYDROcodone-acetaminophen (NORCO/VICODIN) 5-325 MG tablet, Take 1 tablet by mouth every 6 (six) hours as needed for moderate pain., Disp: 30 tablet, Rfl: 0 .  Lancet  Devices (ONE TOUCH DELICA LANCING DEV) MISC, Use to check sugar three times daily for type 2 diabetes E11.9, Disp: 100 each, Rfl: 4 .  levothyroxine (SYNTHROID) 200 MCG tablet, TAKE 1 TABLET BY MOUTH ONCE DAILY BEFORE BREAKFAST, Disp: 90 tablet, Rfl: 1 .  lisinopril (ZESTRIL) 10 MG tablet, TAKE 1 TABLET BY MOUTH ONCE DAILY. SCHEDULE OFFICE VISIT FOR FOLLOW UP, Disp: 30 tablet, Rfl: 11 .  metFORMIN (GLUCOPHAGE) 1000 MG tablet, TAKE 1/2 (ONE-HALF) TABLET BY MOUTH IN THE MORNING AND 1 IN THE EVENING, Disp: 135 tablet, Rfl: 4 .  Milk Thistle 140 MG CAPS, Take 1 capsule by mouth daily. , Disp: , Rfl:  .  naproxen (NAPROSYN) 500 MG tablet, TAKE 1 TABLET BY MOUTH TWICE DAILY AS NEEDED, Disp: 90 tablet, Rfl: 4 .  omeprazole (PRILOSEC) 20 MG capsule, Take 20 mg by mouth daily. , Disp: , Rfl:  .  ondansetron (ZOFRAN) 4 MG tablet, TAKE 1 TABLET BY MOUTH EVERY 8 HOURS AS NEEDED FOR NAUSEA, Disp: 10 tablet, Rfl: 1 .  pioglitazone (ACTOS) 30 MG tablet, Take 1 tablet (30 mg total) by mouth daily., Disp: 30 tablet, Rfl: 5 .  promethazine (PHENERGAN) 25 MG tablet, TAKE 1 TABLET BY MOUTH EVERY 6 HOURS AS NEEDED, Disp: 30 tablet, Rfl: 2 .  Semaglutide,0.25 or 0.5MG /DOS, (OZEMPIC, 0.25 OR 0.5 MG/DOSE,) 2 MG/1.5ML SOPN, Inject 0.5 mg into the skin once a week., Disp: 4 pen, Rfl: 0 .  sertraline (ZOLOFT) 50 MG tablet, Take 1 tablet (50 mg total) by mouth daily., Disp: 30 tablet, Rfl: 12 .  triamcinolone cream (KENALOG) 0.5 %, APPLY CREAM TOPICALLY TO AFFECTED AREA TWICE DAILY, AS DIRECTED, Disp: 30 g, Rfl: 5 .  valACYclovir (VALTREX) 1000 MG tablet, 2 tablets onset of cold sore, take 2 more tablets twelve hours later., Disp: 24 tablet, Rfl: 1 .  Vitamin D, Ergocalciferol, (DRISDOL) 1.25 MG (50000 UT) CAPS capsule, Take 1 capsule (50,000 Units total) by mouth once a week., Disp: 12 capsule, Rfl: 4 .  zolpidem (AMBIEN) 10 MG tablet, Take 1 tablet (10 mg total) by mouth at bedtime as needed. for sleep, Disp: 30 tablet, Rfl: 5   Review of Systems  Constitutional: Negative for appetite change, chills, fatigue and fever.  Respiratory: Negative for chest tightness and shortness of breath.   Cardiovascular: Negative for chest pain and palpitations.  Gastrointestinal: Negative for abdominal pain, nausea and vomiting.  Neurological: Negative for dizziness and weakness.    Social History   Tobacco Use  . Smoking status: Former Smoker    Packs/day: 0.75    Years: 9.00    Pack years: 6.75    Types: Cigarettes    Quit date: 04/06/2016    Years since quitting: 3.0  . Smokeless tobacco: Never Used  Substance Use Topics  . Alcohol use: No    Alcohol/week: 0.0 standard drinks      Objective:    Vitals:   04/28/19 1608  BP: (!) 149/90  Pulse: 80  Temp: (!) 96.8 F (36 C)  TempSrc: Temporal  SpO2: 98%  Weight: (!) 334 lb (151.5 kg)  Body mass index is 59.17  kg/m.   Physical Exam  General appearance: Severely obese female, cooperative and in no acute distress Head: Normocephalic, without obvious abnormality, atraumatic Respiratory: Respirations even and unlabored, normal respiratory rate Extremities: All extremities are intact.  Skin: Skin color, texture, turgor normal. No rashes seen  Psych: Appropriate mood and affect. Neurologic: Mental status: Alert, oriented to person, place, and time, thought content appropriate.  Results for orders placed or performed in visit on 04/28/19  POCT HgB A1C  Result Value Ref Range   Hemoglobin A1C 6.2 (A) 4.0 - 5.6 %   Est. average glucose Bld gHb Est-mCnc 131        Assessment & Plan    1. Diabetes mellitus with coincident hypertension ([place]) Very well controlled Continue current medications.  Follow up 4 months.   2. Fatty liver Doing well on pioglitazone. Continue current medications.  Recheck LFTs at follow up  Other orders - Flu Vaccine QUAD 6+ mos PF IM (Fluarix Quad PF)     Lelon Huh, MD  [facility]

## 2019-04-28 NOTE — Patient Instructions (Addendum)
.   Please review the attached list of medications and notify my office if there are any errors.   . Please bring all of your medications to every appointment so we can make sure that our medication list is the same as yours.   . It is especially important to get the annual flu vaccine this year. If you haven't had it already, please go to your pharmacy or call the office as soon as possible to schedule you flu shot.  . Please contact your eyecare professional to schedule a routine eye exam   

## 2019-05-10 ENCOUNTER — Telehealth: Payer: Self-pay | Admitting: Family Medicine

## 2019-05-10 NOTE — Telephone Encounter (Signed)
Karen Estrada is calling to see if its ok to switch manufacture on levothyroxine medication. [address] in [place]

## 2019-05-10 NOTE — Telephone Encounter (Signed)
That's fine

## 2019-05-12 NOTE — Telephone Encounter (Signed)
Robin with [facility] advised.   Thanks,   -Mickel Baas

## 2019-05-23 ENCOUNTER — Other Ambulatory Visit: Payer: Self-pay | Admitting: Family Medicine

## 2019-05-23 DIAGNOSIS — N946 Dysmenorrhea, unspecified: Secondary | ICD-10-CM

## 2019-05-23 MED ORDER — HYDROCODONE-ACETAMINOPHEN 5-325 MG PO TABS: 1.0000 | ORAL_TABLET | Freq: Four times a day (QID) | ORAL | 0 refills | Status: DC | PRN

## 2019-05-25 ENCOUNTER — Other Ambulatory Visit: Payer: Self-pay | Admitting: Family Medicine

## 2019-05-25 NOTE — Telephone Encounter (Signed)
Requested medication (s) are due for refill today: yes  Requested medication (s) are on the active medication list: yes  Last refill:  05/05/2019  Future visit scheduled: yes  Notes to clinic: refill cannot be delegated    Requested Prescriptions  Pending Prescriptions Disp Refills   ondansetron (ZOFRAN) 4 MG tablet [Pharmacy Med Name: Ondansetron HCl 4 MG Oral Tablet] 10 tablet 0    Sig: TAKE 1 TABLET BY MOUTH EVERY 8 HOURS AS NEEDED FOR NAUSEA      Not Delegated - Gastroenterology: Antiemetics Failed - 05/25/2019  8:30 AM      Failed - This refill cannot be delegated      Passed - Valid encounter within last 6 months    Recent Outpatient Visits           3 weeks ago Diabetes mellitus with coincident hypertension [facility])   [facility] Birdie Sons, MD   5 months ago Essential hypertension   [facility] Birdie Sons, MD   9 months ago Diabetes mellitus with coincident hypertension [facility])   [facility] Birdie Sons, MD   10 months ago Diabetes mellitus with coincident hypertension [facility])   [facility] Birdie Sons, MD   1 year ago Diabetes mellitus with coincident hypertension [facility])   [facility] Birdie Sons, MD       Future Appointments             In 3 months Fisher, Kirstie Peri, MD [facility] [place]

## 2019-06-22 ENCOUNTER — Other Ambulatory Visit: Payer: Self-pay | Admitting: Family Medicine

## 2019-06-22 DIAGNOSIS — N946 Dysmenorrhea, unspecified: Secondary | ICD-10-CM

## 2019-06-22 DIAGNOSIS — E039 Hypothyroidism, unspecified: Secondary | ICD-10-CM

## 2019-06-22 MED ORDER — HYDROCODONE-ACETAMINOPHEN 5-325 MG PO TABS: 1.0000 | ORAL_TABLET | Freq: Four times a day (QID) | ORAL | 0 refills | Status: DC | PRN

## 2019-06-23 ENCOUNTER — Encounter: Payer: Self-pay | Admitting: Family Medicine

## 2019-06-26 ENCOUNTER — Telehealth: Payer: Self-pay | Admitting: Family Medicine

## 2019-06-26 NOTE — Telephone Encounter (Signed)
Samples are ready for pick up.  

## 2019-06-26 NOTE — Telephone Encounter (Signed)
Patient sent MyChart message requesting samples of Ozempic 0.25/0.5 Pen. Can you please have 4 sample pens ready for her in the fridge. Thanks!

## 2019-07-21 ENCOUNTER — Other Ambulatory Visit: Payer: Self-pay | Admitting: Family Medicine

## 2019-07-21 DIAGNOSIS — N946 Dysmenorrhea, unspecified: Secondary | ICD-10-CM

## 2019-07-21 MED ORDER — HYDROCODONE-ACETAMINOPHEN 5-325 MG PO TABS: 1.0000 | ORAL_TABLET | Freq: Four times a day (QID) | ORAL | 0 refills | Status: DC | PRN

## 2019-07-22 ENCOUNTER — Other Ambulatory Visit: Payer: Self-pay | Admitting: Family Medicine

## 2019-07-22 DIAGNOSIS — E039 Hypothyroidism, unspecified: Secondary | ICD-10-CM

## 2019-07-22 DIAGNOSIS — K760 Fatty (change of) liver, not elsewhere classified: Secondary | ICD-10-CM

## 2019-07-22 MED ORDER — PIOGLITAZONE HCL 30 MG PO TABS: 30.0000 mg | ORAL_TABLET | Freq: Every day | ORAL | 0 refills | Status: DC

## 2019-07-22 MED ORDER — LEVOTHYROXINE SODIUM 200 MCG PO TABS: ORAL_TABLET | ORAL | 1 refills | Status: DC

## 2019-07-22 NOTE — Addendum Note (Signed)
Addended by: Corky Sox E on: 07/22/2019 11:21 AM   Modules accepted: Orders

## 2019-07-22 NOTE — Addendum Note (Signed)
Addended by: Carlisle Beers on: 07/22/2019 12:29 PM   Modules accepted: Orders

## 2019-07-22 NOTE — Telephone Encounter (Signed)
Previous attempt to send to pharmacy requested failed. Resending refill request.

## 2019-08-27 ENCOUNTER — Other Ambulatory Visit: Payer: Self-pay | Admitting: Family Medicine

## 2019-08-27 DIAGNOSIS — N946 Dysmenorrhea, unspecified: Secondary | ICD-10-CM

## 2019-08-31 MED ORDER — HYDROCODONE-ACETAMINOPHEN 5-325 MG PO TABS: 1.0000 | ORAL_TABLET | Freq: Four times a day (QID) | ORAL | 0 refills | Status: DC | PRN

## 2019-09-01 ENCOUNTER — Ambulatory Visit (INDEPENDENT_AMBULATORY_CARE_PROVIDER_SITE_OTHER): Payer: 59 | Admitting: Family Medicine

## 2019-09-01 ENCOUNTER — Other Ambulatory Visit: Payer: Self-pay

## 2019-09-01 ENCOUNTER — Encounter: Payer: Self-pay | Admitting: Family Medicine

## 2019-09-01 VITALS — BP 141/84 | HR 75 | Temp 95.3°F | Wt 348.8 lb

## 2019-09-01 DIAGNOSIS — K760 Fatty (change of) liver, not elsewhere classified: Secondary | ICD-10-CM

## 2019-09-01 DIAGNOSIS — I10 Essential (primary) hypertension: Secondary | ICD-10-CM

## 2019-09-01 DIAGNOSIS — E119 Type 2 diabetes mellitus without complications: Secondary | ICD-10-CM | POA: Diagnosis not present

## 2019-09-01 LAB — POCT GLYCOSYLATED HEMOGLOBIN (HGB A1C)
Est. average glucose Bld gHb Est-mCnc: 123
Hemoglobin A1C: 5.9 % — AB (ref 4.0–5.6)

## 2019-09-01 MED ORDER — OZEMPIC (0.25 OR 0.5 MG/DOSE) 2 MG/1.5ML ~~LOC~~ SOPN: 1.0000 mg | PEN_INJECTOR | SUBCUTANEOUS | Status: DC

## 2019-09-01 NOTE — Patient Instructions (Signed)
.   We don't currently have samples of the 1mg  Ozempic pen, but you can take 0.5mg  twice a week, or 2 shots of the 0.5mg  once a week.    You can stop the pioglitazone for the time being.

## 2019-09-01 NOTE — Progress Notes (Signed)
Patient: Karen Estrada   DOB: 01/28/1971   49 y.o. Female  MRN: [id number] Visit Date: 09/01/2019  Today's Provider: Lelon Huh, MD  Subjective:    Chief Complaint  Patient presents with  . Hypertension  . Diabetes Mellitus   HPI  Hyperglycemia, Follow-up:   Lab Results  Component Value Date   HGBA1C 6.2 (A) 04/28/2019   HGBA1C 6.1 (A) 12/23/2018   HGBA1C 7.8 (A) 08/26/2018   GLUCOSE 115 (H) 12/26/2018   GLUCOSE 153 (H) 03/25/2018   GLUCOSE 181 (H) 07/09/2017    Last seen for for this 4 months ago.  Management since then includes no changes. Current symptoms include none and have been stable.  Weight trend: increasing steadily Prior visit with dietician: no Current diet: well balanced Current exercise: walking  Pertinent Labs:    Component Value Date/Time   CHOL 154 12/26/2018 1434   TRIG 121 12/26/2018 1434   CHOLHDL 3.4 12/26/2018 1434   CREATININE 0.68 12/26/2018 1434    Wt Readings from Last 3 Encounters:  04/28/19 (!) 334 lb (151.5 kg)  12/23/18 (!) 335 lb (152 kg)  09/30/18 (!) 336 lb (152.4 kg)     Hypertension, follow-up:  BP Readings from Last 3 Encounters:  04/28/19 (!) 149/90  12/23/18 124/82  09/30/18 140/90    She was last seen for hypertension 4 months ago.  BP at that visit was 149/90. Management since that visit includes no changes**. She reports good compliance with treatment. She is not having side effects.  She is exercising. She is adherent to low salt diet.   Outside blood pressures are not being checked regularly. She is experiencing none.  Patient denies chest pain, chest pressure/discomfort, fatigue, irregular heart beat and syncope.   Cardiovascular risk factors include diabetes mellitus and hypertension.  Use of agents associated with hypertension: none.     Weight trend: increasing steadily Wt Readings from Last 3 Encounters:  04/28/19 (!) 334 lb (151.5 kg)  12/23/18 (!) 335 lb (152 kg)  09/30/18  (!) 336 lb (152.4 kg)    Current diet: well balanced  ------------------------------------------------------------------------   Follow up for Fatty Liver  The patient was last seen for this 4 months ago. Changes made at last visit include no changes.  She reports good compliance with treatment. She feels that condition is Improved. She is not having side effects.   ------------------------------------------------------------------------------------     Medications: Outpatient Medications Prior to Visit  Medication Sig Dispense Refill  . atorvastatin (LIPITOR) 20 MG tablet Take 1 tablet by mouth once daily 90 tablet 4  . Coenzyme Q10 (COQ10) 100 MG CAPS Take 1 tablet by mouth daily.    Marland Kitchen glucose blood (ONETOUCH VERIO) test strip Use as instructed to check sugar three times daily for type 2 diabetes E11.9 100 each 4  . HYDROcodone-acetaminophen (NORCO/VICODIN) 5-325 MG tablet Take 1 tablet by mouth every 6 (six) hours as needed for moderate pain. 30 tablet 0  . Lancet Devices (ONE TOUCH DELICA LANCING DEV) MISC Use to check sugar three times daily for type 2 diabetes E11.9 100 each 4  . levothyroxine (EUTHYROX) 200 MCG tablet TAKE 1 TABLET BY MOUTH ONCE DAILY BEFORE BREAKFAST 90 tablet 1  . lisinopril (ZESTRIL) 10 MG tablet TAKE 1 TABLET BY MOUTH ONCE DAILY. SCHEDULE OFFICE VISIT FOR FOLLOW UP 30 tablet 11  . metFORMIN (GLUCOPHAGE) 1000 MG tablet TAKE 1/2 (ONE-HALF) TABLET BY MOUTH IN THE MORNING AND 1 IN THE EVENING  135 tablet 4  . Milk Thistle 140 MG CAPS Take 1 capsule by mouth daily.     . naproxen (NAPROSYN) 500 MG tablet TAKE 1 TABLET BY MOUTH TWICE DAILY AS NEEDED 90 tablet 4  . omeprazole (PRILOSEC) 20 MG capsule Take 20 mg by mouth daily.     . ondansetron (ZOFRAN) 4 MG tablet TAKE 1 TABLET BY MOUTH EVERY 8 HOURS AS NEEDED FOR NAUSEA 10 tablet 2  . pioglitazone (ACTOS) 30 MG tablet Take 1 tablet (30 mg total) by mouth daily. 90 tablet 0  . promethazine (PHENERGAN) 25 MG  tablet TAKE 1 TABLET BY MOUTH EVERY 6 HOURS AS NEEDED 30 tablet 2  . Semaglutide,0.25 or 0.5MG /DOS, (OZEMPIC, 0.25 OR 0.5 MG/DOSE,) 2 MG/1.5ML SOPN Inject 0.5 mg into the skin once a week. 4 pen 0  . sertraline (ZOLOFT) 50 MG tablet Take 1 tablet (50 mg total) by mouth daily. 30 tablet 12  . triamcinolone cream (KENALOG) 0.5 % APPLY CREAM TOPICALLY TO AFFECTED AREA TWICE DAILY, AS DIRECTED 30 g 5  . valACYclovir (VALTREX) 1000 MG tablet 2 tablets onset of cold sore, take 2 more tablets twelve hours later. 24 tablet 1  . Vitamin D, Ergocalciferol, (DRISDOL) 1.25 MG (50000 UT) CAPS capsule Take 1 capsule (50,000 Units total) by mouth once a week. 12 capsule 4  . zolpidem (AMBIEN) 10 MG tablet Take 1 tablet (10 mg total) by mouth at bedtime as needed. for sleep 30 tablet 5   No facility-administered medications prior to visit.    Review of Systems  Constitutional: Negative for appetite change, chills, fatigue and fever.  Respiratory: Negative for chest tightness and shortness of breath.   Cardiovascular: Negative for chest pain and palpitations.  Gastrointestinal: Negative for abdominal pain, nausea and vomiting.  Neurological: Negative for dizziness and weakness.      Objective:   There were no vitals taken for this visit. Vitals:   09/01/19 1610  BP: (!) 141/84  Pulse: 75  Temp: (!) 95.3 F (35.2 C)  TempSrc: Temporal  Weight: (!) 348 lb 12.8 oz (158.2 kg)      Physical Exam    General: Appearance:    Obese female in no acute distress  Eyes:    PERRL, conjunctiva/corneas clear, EOM's intact       Lungs:     Clear to auscultation bilaterally, respirations unlabored  Heart:    Normal heart rate. Normal rhythm. No murmurs, rubs, or gallops.   MS:   All extremities are intact.   Neurologic:   Awake, alert, oriented x 3. No apparent focal neurological           defect.        Results for orders placed or performed in visit on 09/01/19  POCT HgB A1C  Result Value Ref Range    Hemoglobin A1C 5.9 (A) 4.0 - 5.6 %   Est. average glucose Bld gHb Est-mCnc 123       Assessment & Plan:    1. Diabetes mellitus with coincident hypertension ([place]) Very well controlled, tolerating Ozempic much better than Trulicity. I think we would greatly benefit from weight loss so will titrate up on  - Semaglutide,0.25 or 0.5MG /DOS, (OZEMPIC, 0.25 OR 0.5 MG/DOSE,) 2 MG/1.5ML SOPN; Inject 1 mg into the skin once a week.  Dispense: 6 pen  And put pioglitazone on holed  2. Fatty liver Would benefit from weight loss. Put pio on hold as above, although consider restarting if she does not lose weight  with increased dose of Ozempic.     The entirety of the information documented in the History of Present Illness, Review of Systems and Physical Exam were personally obtained by me. Portions of this information were initially documented by Elonda Husky, CMA and reviewed by me for thoroughness and accuracy.     Lelon Huh, MD  [facility] [phone] (phone) [phone] (fax)  [place]

## 2019-09-29 ENCOUNTER — Other Ambulatory Visit: Payer: Self-pay | Admitting: Family Medicine

## 2019-09-29 DIAGNOSIS — N946 Dysmenorrhea, unspecified: Secondary | ICD-10-CM

## 2019-09-29 MED ORDER — HYDROCODONE-ACETAMINOPHEN 5-325 MG PO TABS: 1.0000 | ORAL_TABLET | Freq: Four times a day (QID) | ORAL | 0 refills | Status: DC | PRN

## 2019-10-26 ENCOUNTER — Other Ambulatory Visit: Payer: Self-pay | Admitting: Family Medicine

## 2019-10-26 DIAGNOSIS — N946 Dysmenorrhea, unspecified: Secondary | ICD-10-CM

## 2019-10-26 DIAGNOSIS — K760 Fatty (change of) liver, not elsewhere classified: Secondary | ICD-10-CM

## 2019-10-26 DIAGNOSIS — E039 Hypothyroidism, unspecified: Secondary | ICD-10-CM

## 2019-10-26 NOTE — Telephone Encounter (Signed)
Requested medication (s) are due for refill today -yes  Requested medication (s) are on the active medication list -yes  Future visit scheduled -yes  Last refill: 09/21/19  Notes to clinic: Request for non delegated Rx  Requested Prescriptions  Pending Prescriptions Disp Refills   ondansetron (ZOFRAN) 4 MG tablet [Pharmacy Med Name: Ondansetron HCl 4 MG Oral Tablet] 10 tablet 0    Sig: TAKE 1 TABLET BY MOUTH EVERY 8 HOURS AS NEEDED FOR NAUSEA      Not Delegated - Gastroenterology: Antiemetics Failed - 10/26/2019 10:39 AM      Failed - This refill cannot be delegated      Passed - Valid encounter within last 6 months    Recent Outpatient Visits           1 month ago Diabetes mellitus with coincident hypertension [facility])   [facility] Birdie Sons, MD   6 months ago Diabetes mellitus with coincident hypertension [facility])   [facility] Birdie Sons, MD   10 months ago Essential hypertension   [facility] Birdie Sons, MD   1 year ago Diabetes mellitus with coincident hypertension [facility])   [facility] Birdie Sons, MD   1 year ago Diabetes mellitus with coincident hypertension [facility])   [facility] Birdie Sons, MD       Future Appointments             In 2 months Fisher, Kirstie Peri, MD [facility]             Signed Prescriptions Disp Refills   EUTHYROX 200 MCG tablet 90 tablet 0    Sig: TAKE 1 TABLET BY MOUTH ONCE DAILY BEFORE BREAKFAST      Endocrinology:  Hypothyroid Agents Failed - 10/26/2019 10:39 AM      Failed - TSH needs to be rechecked within 3 months after an abnormal result. Refill until TSH is due.      Passed - TSH in normal range and within 360 days    TSH  Date Value Ref Range Status  12/26/2018 0.584 0.450 - 4.500 uIU/mL Final          Passed - Valid encounter within last 12 months    Recent Outpatient Visits           1 month ago Diabetes  mellitus with coincident hypertension [facility])   [facility] Birdie Sons, MD   6 months ago Diabetes mellitus with coincident hypertension [facility])   [facility] Birdie Sons, MD   10 months ago Essential hypertension   [facility] Birdie Sons, MD   1 year ago Diabetes mellitus with coincident hypertension [facility])   [facility] Birdie Sons, MD   1 year ago Diabetes mellitus with coincident hypertension [facility])   [facility] Birdie Sons, MD       Future Appointments             In 2 months Fisher, Kirstie Peri, MD [facility]             Refused Prescriptions Disp Refills   pioglitazone (ACTOS) 30 MG tablet [Pharmacy Med Name: Pioglitazone HCl 30 MG Oral Tablet] 30 tablet 0    Sig: Take 1 tablet by mouth once daily      Endocrinology:  Diabetes - Glitazones - pioglitazone Passed - 10/26/2019 10:39 AM      Passed -  HBA1C is between 0 and 7.9 and within 180 days    Hemoglobin A1C  Date Value Ref Range Status  09/01/2019 5.9 (A) 4.0 - 5.6 % Final   Hgb A1c MFr Bld  Date Value Ref Range Status  07/09/2017 7.4 (H) 4.8 - 5.6 % Final    Comment:             Prediabetes: 5.7 - 6.4          Diabetes: >6.4          Glycemic control for adults with diabetes: <7.0           Passed - Valid encounter within last 6 months    Recent Outpatient Visits           1 month ago Diabetes mellitus with coincident hypertension [facility])   [facility] Birdie Sons, MD   6 months ago Diabetes mellitus with coincident hypertension [facility])   [facility] Birdie Sons, MD   10 months ago Essential hypertension   [facility] Birdie Sons, MD   1 year ago Diabetes mellitus with coincident hypertension [facility])   [facility] Birdie Sons, MD   1 year ago Diabetes mellitus with coincident hypertension [facility])   [place], Kirstie Peri, MD       Future Appointments             In 2 months Fisher, Kirstie Peri, MD [facility]                Requested Prescriptions  Pending Prescriptions Disp Refills   ondansetron (ZOFRAN) 4 MG tablet [Pharmacy Med Name: Ondansetron HCl 4 MG Oral Tablet] 10 tablet 0    Sig: TAKE 1 TABLET BY MOUTH EVERY 8 HOURS AS NEEDED FOR NAUSEA      Not Delegated - Gastroenterology: Antiemetics Failed - 10/26/2019 10:39 AM      Failed - This refill cannot be delegated      Passed - Valid encounter within last 6 months    Recent Outpatient Visits           1 month ago Diabetes mellitus with coincident hypertension [facility])   [facility] Birdie Sons, MD   6 months ago Diabetes mellitus with coincident hypertension [facility])   [facility] Birdie Sons, MD   10 months ago Essential hypertension   [facility] Birdie Sons, MD   1 year ago Diabetes mellitus with coincident hypertension [facility])   [facility] Birdie Sons, MD   1 year ago Diabetes mellitus with coincident hypertension [facility])   [facility] Birdie Sons, MD       Future Appointments             In 2 months Fisher, Kirstie Peri, MD [facility]             Signed Prescriptions Disp Refills   EUTHYROX 200 MCG tablet 90 tablet 0    Sig: TAKE 1 TABLET BY MOUTH ONCE DAILY BEFORE BREAKFAST      Endocrinology:  Hypothyroid Agents Failed - 10/26/2019 10:39 AM      Failed - TSH needs to be rechecked within 3 months after an abnormal result. Refill until TSH is due.      Passed - TSH in normal range and within 360 days    TSH  Date Value Ref Range Status  12/26/2018 0.584  0.450 - 4.500 uIU/mL Final          Passed - Valid encounter within last 12 months    Recent Outpatient Visits           1 month ago Diabetes mellitus with coincident hypertension [facility])   [facility] Birdie Sons, MD   6 months ago Diabetes mellitus with coincident hypertension [facility])   [facility] Birdie Sons, MD   10 months ago Essential hypertension   [facility] Birdie Sons, MD   1 year ago Diabetes mellitus with coincident hypertension [facility])   [facility] Birdie Sons, MD   1 year ago Diabetes mellitus with coincident hypertension [facility])   [facility] Birdie Sons, MD       Future Appointments             In 2 months Fisher, Kirstie Peri, MD [facility]             Refused Prescriptions Disp Refills   pioglitazone (ACTOS) 30 MG tablet [Pharmacy Med Name: Pioglitazone HCl 30 MG Oral Tablet] 30 tablet 0    Sig: Take 1 tablet by mouth once daily      Endocrinology:  Diabetes - Glitazones - pioglitazone Passed - 10/26/2019 10:39 AM      Passed - HBA1C is between 0 and 7.9 and within 180 days    Hemoglobin A1C  Date Value Ref Range Status  09/01/2019 5.9 (A) 4.0 - 5.6 % Final   Hgb A1c MFr Bld  Date Value Ref Range Status  07/09/2017 7.4 (H) 4.8 - 5.6 % Final    Comment:             Prediabetes: 5.7 - 6.4          Diabetes: >6.4          Glycemic control for adults with diabetes: <7.0           Passed - Valid encounter within last 6 months    Recent Outpatient Visits           1 month ago Diabetes mellitus with coincident hypertension [facility])   [facility] Birdie Sons, MD   6 months ago Diabetes mellitus with coincident hypertension [facility])   [facility] Birdie Sons, MD   10 months ago Essential hypertension   [facility] Birdie Sons, MD   1 year ago Diabetes mellitus with coincident hypertension [facility])   [facility] Birdie Sons, MD   1 year ago Diabetes mellitus with coincident hypertension [facility])   [facility] Birdie Sons, MD       Future  Appointments             In 2 months Fisher, Kirstie Peri, MD [facility] [place]

## 2019-10-27 MED ORDER — HYDROCODONE-ACETAMINOPHEN 5-325 MG PO TABS: 1.0000 | ORAL_TABLET | Freq: Four times a day (QID) | ORAL | 0 refills | Status: DC | PRN

## 2019-11-05 ENCOUNTER — Ambulatory Visit (INDEPENDENT_AMBULATORY_CARE_PROVIDER_SITE_OTHER)
Admission: RE | Admit: 2019-11-05 | Discharge: 2019-11-05 | Disposition: A | Discharge status: Home / Self Care | Payer: 59 | Source: Ambulatory Visit

## 2019-11-05 DIAGNOSIS — R220 Localized swelling, mass and lump, head: Secondary | ICD-10-CM | POA: Diagnosis not present

## 2019-11-05 MED ORDER — PREDNISONE 10 MG (21) PO TBPK: ORAL_TABLET | Freq: Every day | ORAL | 0 refills | Status: DC

## 2019-11-05 NOTE — ED Provider Notes (Signed)
Virtual Visit via Video Note:  Karen Estrada  initiated request for Telemedicine visit with [facility] team. I connected with Karen Estrada  on 11/05/2019 at 12:01 PM  for a synchronized telemedicine visit using a video enabled HIPPA compliant telemedicine application. I verified that I am speaking with Karen Estrada  using two identifiers. [place] Hall-Potvin, PA-C  was physically located in a [place] [facility]gent care site and GURVEEN TIGGS was located at a different location.   The limitations of evaluation and management by telemedicine as well as the availability of in-person appointments were discussed. Patient was informed that she  may incur a bill ( including co-pay) for this virtual visit encounter. Karen Estrada  expressed understanding and gave verbal consent to proceed with virtual visit.     History of Present Illness:Karen Estrada  is a 49 y.o. female w/ h/o T2DM, GERD, NAFLD, PCOS presenting for facial swelling x2 days.  Patient endorses "bad allergies ".  Denies change in food, topical products, bug bite.  No fever, chills, throbs, myalgias, does endorse upper lip swelling: No swelling of tongue, throat, difficulty breathing, chest pain.  Has tried OTC nasal spray, Mucinex, Zyrtec without relief.  States that she has had this in the past: Last time was October 1-2 years ago.  Denies nausea, vomiting, abdominal pain, diarrhea.  ROI as per HPI  Past Medical History:  Diagnosis Date  . Diabetes mellitus without complication ([place])   . Fatty liver 03/11/2018  . GERD (gastroesophageal reflux disease)   . History of measles   . Hyperlipidemia   . PCOS (polycystic ovarian syndrome)     Allergies  Allergen Reactions  . Fluticasone     Caused nosebleeds  . Lovastatin     Elevated CK (1012)  . Pioglitazone Diarrhea        Observations/Objective: 49 y.o. female sitting in no acute distress.  Patient is able to speak in full sentences without  coughing, sneezing, wheezing.  Face mildly edematous with slight erythema.  Patient denies warmth, exquisite TTP.  No open wounds, discharge.  Upper lip mildly edematous diffusely/bilaterally.  No lower lip swelling.  Patient denies dental pain.  Assessment and Plan: H&P concerning for facial swelling second to histamine response: We will start prednisone, monitor closely.  Low concern for infectious process at this time due to lack of open wounds, fever, activity with tenderness.  Discussed return precautions thereof: Patient to seek him person evaluation should she have persistent or worsening symptoms despite steroid use.  Return precautions discussed, patient verbalized understanding and is agreeable to plan.  Follow Up Instructions: Patient to seek in-person evaluation for persistent/worsening symptoms.   I discussed the assessment and treatment plan with the patient. The patient was provided an opportunity to ask questions and all were answered. The patient agreed with the plan and demonstrated an understanding of the instructions.   The patient was advised to call back or seek an in-person evaluation if the symptoms worsen or if the condition fails to improve as anticipated.  I provided 10 minutes of non-face-to-face time during this encounter.    [place] Hall-Potvin, PA-C  11/05/2019 12:01 PM        Hall-Potvin, [place], Hershal Coria 11/05/19 1228

## 2019-11-05 NOTE — Discharge Instructions (Addendum)
Steroid as discussed with [id number] May continue allergy medication, use cool compresses on face. Very important that you seek in-person evaluation for worsening swelling, difficulty breathing, pain, fever.

## 2019-12-05 ENCOUNTER — Other Ambulatory Visit: Payer: Self-pay | Admitting: Family Medicine

## 2019-12-05 DIAGNOSIS — N946 Dysmenorrhea, unspecified: Secondary | ICD-10-CM

## 2019-12-05 MED ORDER — HYDROCODONE-ACETAMINOPHEN 5-325 MG PO TABS: 1.0000 | ORAL_TABLET | Freq: Four times a day (QID) | ORAL | 0 refills | Status: DC | PRN

## 2019-12-12 MED ORDER — SERTRALINE HCL 50 MG PO TABS: 50.0000 mg | ORAL_TABLET | Freq: Every day | ORAL | 12 refills | Status: DC

## 2019-12-12 NOTE — Addendum Note (Signed)
Addended by: Althea Charon D on: 12/12/2019 07:32 AM   Modules accepted: Orders

## 2020-01-04 ENCOUNTER — Other Ambulatory Visit: Payer: Self-pay | Admitting: Family Medicine

## 2020-01-04 DIAGNOSIS — N946 Dysmenorrhea, unspecified: Secondary | ICD-10-CM

## 2020-01-04 DIAGNOSIS — E039 Hypothyroidism, unspecified: Secondary | ICD-10-CM

## 2020-01-04 MED ORDER — HYDROCODONE-ACETAMINOPHEN 5-325 MG PO TABS: 1.0000 | ORAL_TABLET | Freq: Four times a day (QID) | ORAL | 0 refills | Status: DC | PRN

## 2020-01-05 ENCOUNTER — Ambulatory Visit: Payer: Self-pay | Admitting: Family Medicine

## 2020-01-10 NOTE — Progress Notes (Signed)
Established patient visit   Patient: Karen Estrada   DOB: February 15, 1971   49 y.o. Female  MRN: [id number] Visit Date: 01/12/2020  Today's healthcare provider: Lelon Huh, MD   Chief Complaint  Patient presents with  . Diabetes  . Hypertension   I,Latasha Walston,acting as a scribe for Lelon Huh, MD.,have documented all relevant documentation on the behalf of Lelon Huh, MD,as directed by  Lelon Huh, MD while in the presence of Lelon Huh, MD.  Subjective    HPI  Diabetes Mellitus Type II, Follow-up  Lab Results  Component Value Date   HGBA1C 5.9 (A) 09/01/2019   HGBA1C 6.2 (A) 04/28/2019   HGBA1C 6.1 (A) 12/23/2018   Wt Readings from Last 3 Encounters:  01/12/20 (!) 341 lb (154.7 kg)  09/01/19 (!) 348 lb 12.8 oz (158.2 kg)  04/28/19 (!) 334 lb (151.5 kg)   Last seen for diabetes 4 months ago.  Management since then includes titrated up on Ozempic to 1 mg weekly. She has been using the sample pens and taking 2 injections of the 0.5mg  dose and tolerating well.  She reports good compliance with treatment. She is not having side effects.  Symptoms: No fatigue No foot ulcerations  No appetite changes No nausea  No paresthesia of the feet  No polydipsia  No polyuria No visual disturbances   No vomiting     Home blood sugar records: fasting range: 110  Episodes of hypoglycemia? No    Current insulin regiment: Ozempic Most Recent Eye Exam: not UTD Current exercise: walking Current diet habits: in general, a "healthy" diet    Pertinent Labs: Lab Results  Component Value Date   CHOL 154 12/26/2018   HDL 45 12/26/2018   LDLCALC 85 12/26/2018   TRIG 121 12/26/2018   CHOLHDL 3.4 12/26/2018   Lab Results  Component Value Date   NA 139 12/26/2018   K 4.1 12/26/2018   CREATININE 0.68 12/26/2018   GFRNONAA 104 12/26/2018   GFRAA 120 12/26/2018   GLUCOSE 115 (H) 12/26/2018      --------------------------------------------------------------------------------------------------- Hypertension, follow-up  BP Readings from Last 3 Encounters:  01/12/20 (!) 165/89  09/01/19 (!) 141/84  04/28/19 (!) 149/90   Wt Readings from Last 3 Encounters:  01/12/20 (!) 341 lb (154.7 kg)  09/01/19 (!) 348 lb 12.8 oz (158.2 kg)  04/28/19 (!) 334 lb (151.5 kg)     She was last seen for hypertension 4 months ago.  BP at that visit was 141/84. Management since that visit includes no change.  She reports good compliance with treatment. She is not having side effects.  She is following a Regular diet. She is exercising lightly. She does not smoke.  Use of agents associated with hypertension: NSAIDS.   Outside blood pressures are being checked at home. Symptoms: No chest pain No chest pressure  No palpitations No syncope  No dyspnea No orthopnea  No paroxysmal nocturnal dyspnea No lower extremity edema   Pertinent labs: Lab Results  Component Value Date   CHOL 154 12/26/2018   HDL 45 12/26/2018   LDLCALC 85 12/26/2018   TRIG 121 12/26/2018   CHOLHDL 3.4 12/26/2018   Lab Results  Component Value Date   NA 139 12/26/2018   K 4.1 12/26/2018   CREATININE 0.68 12/26/2018   GFRNONAA 104 12/26/2018   GFRAA 120 12/26/2018   GLUCOSE 115 (H) 12/26/2018     The 10-year ASCVD risk score Mikey Bussing DC Jr., et al., 2013) is: 4.4%   ---------------------------------------------------------------------------------------------------  Medications: Outpatient Medications Prior to Visit  Medication Sig  . atorvastatin (LIPITOR) 20 MG tablet Take 1 tablet by mouth once daily  . Coenzyme Q10 (COQ10) 100 MG CAPS Take 1 tablet by mouth daily.  Marland Kitchen glucose blood (ONETOUCH VERIO) test strip Use as instructed to check sugar three times daily for type 2 diabetes E11.9  . HYDROcodone-acetaminophen (NORCO/VICODIN) 5-325 MG tablet Take 1 tablet by mouth every 6 (six) hours as needed  for moderate pain.  Elmore Guise Devices (ONE TOUCH DELICA LANCING DEV) MISC Use to check sugar three times daily for type 2 diabetes E11.9  . levothyroxine (SYNTHROID) 200 MCG tablet TAKE 1 TABLET BY MOUTH ONCE DAILY BEFORE BREAKFAST  . lisinopril (ZESTRIL) 10 MG tablet TAKE 1 TABLET BY MOUTH ONCE DAILY. SCHEDULE OFFICE VISIT FOR FOLLOW UP  . metFORMIN (GLUCOPHAGE) 1000 MG tablet TAKE 1/2 (ONE-HALF) TABLET BY MOUTH IN THE MORNING AND 1 IN THE EVENING  . Milk Thistle 140 MG CAPS Take 1 capsule by mouth daily.   . naproxen (NAPROSYN) 500 MG tablet TAKE 1 TABLET BY MOUTH TWICE DAILY AS NEEDED  . omeprazole (PRILOSEC) 20 MG capsule Take 20 mg by mouth daily.   . ondansetron (ZOFRAN) 4 MG tablet TAKE 1 TABLET BY MOUTH EVERY 8 HOURS AS NEEDED FOR NAUSEA  . predniSONE (STERAPRED UNI-PAK 21 TAB) 10 MG (21) TBPK tablet Take by mouth daily. Take steroid taper as written  . promethazine (PHENERGAN) 25 MG tablet TAKE 1 TABLET BY MOUTH EVERY 6 HOURS AS NEEDED (Patient not taking: Reported on 09/01/2019)  . Semaglutide,0.25 or 0.5MG /DOS, (OZEMPIC, 0.25 OR 0.5 MG/DOSE,) 2 MG/1.5ML SOPN Inject 1 mg into the skin once a week.  . sertraline (ZOLOFT) 50 MG tablet Take 1 tablet (50 mg total) by mouth daily.  Marland Kitchen triamcinolone cream (KENALOG) 0.5 % APPLY CREAM TOPICALLY TO AFFECTED AREA TWICE DAILY, AS DIRECTED  . valACYclovir (VALTREX) 1000 MG tablet 2 tablets onset of cold sore, take 2 more tablets twelve hours later.  . Vitamin D, Ergocalciferol, (DRISDOL) 1.25 MG (50000 UT) CAPS capsule Take 1 capsule (50,000 Units total) by mouth once a week.  . zolpidem (AMBIEN) 10 MG tablet Take 1 tablet (10 mg total) by mouth at bedtime as needed. for sleep   No facility-administered medications prior to visit.    Review of Systems  Constitutional: Negative.   Respiratory: Negative.   Cardiovascular: Negative.   Endocrine: Negative.   Musculoskeletal: Negative.        Objective    BP (!) 165/89 (BP Location: Right Arm,  Patient Position: Sitting, Cuff Size: Large)   Pulse 84   Temp 98.1 F (36.7 C) (Oral)   Ht 5\' 3"  (1.6 m)   Wt (!) 341 lb (154.7 kg)   BMI 60.41 kg/m     Physical Exam   General: Appearance:    Severely obese female in no acute distress  Eyes:    PERRL, conjunctiva/corneas clear, EOM's intact       Lungs:     Clear to auscultation bilaterally, respirations unlabored  Heart:    Normal heart rate. Normal rhythm. No murmurs, rubs, or gallops.   MS:   All extremities are intact.   Neurologic:   Awake, alert, oriented x 3. No apparent focal neurological           defect.         Assessment & Plan     1. Diabetes mellitus with coincident hypertension ([place]) Doing well with 1mg  Ozempic weekly.  Given 2 sample pens of the 0.25/0.5 dose, and coupon with printed prescription for the 1mg  pen - HgB A1c  2. Essential hypertension She reports home BP much better than office Bps, consistently in the 130s. Continue current medications.   - CBC with Differential - Comprehensive Metabolic Panel (CMET)  3. Hyperlipidemia, unspecified hyperlipidemia type Consider adding statin.  - Lipid Profile  4. Hypothyroidism, unspecified type  - TSH  5. Fever blister She needs refill- valACYclovir (VALTREX) 1000 MG tablet; 2 tablets onset of cold sore, take 2 more tablets twelve hours later.  Dispense: 24 tablet; Refill: 1  6. Vitamin D deficiency  - VITAMIN D 25 Hydroxy (Vit-D Deficiency, Fractures)  7. Encounter for screening for HIV  - HIV Antibody (routine testing w rflx)  Other orders - Semaglutide, 1 MG/DOSE, (OZEMPIC, 1 MG/DOSE,) 2 MG/1.5ML SOPN; Inject 0.75 mLs (1 mg total) into the skin once a week.  Dispense: 6 pen; Refill: 4   No follow-ups on file.      The entirety of the information documented in the History of Present Illness, Review of Systems and Physical Exam were personally obtained by me. Portions of this information were initially documented by the CMA and reviewed by me  for thoroughness and accuracy.      Lelon Huh, MD  [facility] [phone] (phone) [phone] (fax)  [place]

## 2020-01-12 ENCOUNTER — Ambulatory Visit (INDEPENDENT_AMBULATORY_CARE_PROVIDER_SITE_OTHER): Payer: 59 | Admitting: Family Medicine

## 2020-01-12 ENCOUNTER — Encounter: Payer: Self-pay | Admitting: Family Medicine

## 2020-01-12 ENCOUNTER — Other Ambulatory Visit: Payer: Self-pay

## 2020-01-12 VITALS — BP 165/89 | HR 84 | Temp 98.1°F | Ht 63.0 in | Wt 341.0 lb

## 2020-01-12 DIAGNOSIS — E119 Type 2 diabetes mellitus without complications: Secondary | ICD-10-CM

## 2020-01-12 DIAGNOSIS — Z114 Encounter for screening for human immunodeficiency virus [HIV]: Secondary | ICD-10-CM

## 2020-01-12 DIAGNOSIS — I10 Essential (primary) hypertension: Secondary | ICD-10-CM

## 2020-01-12 DIAGNOSIS — E039 Hypothyroidism, unspecified: Secondary | ICD-10-CM | POA: Diagnosis not present

## 2020-01-12 DIAGNOSIS — E785 Hyperlipidemia, unspecified: Secondary | ICD-10-CM | POA: Diagnosis not present

## 2020-01-12 DIAGNOSIS — B001 Herpesviral vesicular dermatitis: Secondary | ICD-10-CM

## 2020-01-12 DIAGNOSIS — E559 Vitamin D deficiency, unspecified: Secondary | ICD-10-CM

## 2020-01-12 MED ORDER — OZEMPIC (1 MG/DOSE) 2 MG/1.5ML ~~LOC~~ SOPN: 1.0000 mg | PEN_INJECTOR | SUBCUTANEOUS | 12 refills | Status: DC

## 2020-01-12 MED ORDER — OZEMPIC (1 MG/DOSE) 2 MG/1.5ML ~~LOC~~ SOPN: 1.0000 mg | PEN_INJECTOR | SUBCUTANEOUS | 4 refills | Status: DC

## 2020-01-12 MED ORDER — VALACYCLOVIR HCL 1 G PO TABS: ORAL_TABLET | ORAL | 1 refills | Status: AC

## 2020-01-12 NOTE — Patient Instructions (Addendum)
Be sure to call [facility] to get caught up your routine Gyn exam and cervical cancer screening

## 2020-01-27 LAB — COMPREHENSIVE METABOLIC PANEL
ALT: 34 IU/L — ABNORMAL HIGH (ref 0–32)
AST: 32 IU/L (ref 0–40)
Albumin/Globulin Ratio: 1.3 (ref 1.2–2.2)
Albumin: 4.1 g/dL (ref 3.8–4.8)
Alkaline Phosphatase: 131 IU/L — ABNORMAL HIGH (ref 48–121)
BUN/Creatinine Ratio: 13 (ref 9–23)
BUN: 9 mg/dL (ref 6–24)
Bilirubin Total: 0.5 mg/dL (ref 0.0–1.2)
CO2: 24 mmol/L (ref 20–29)
Calcium: 9.6 mg/dL (ref 8.7–10.2)
Chloride: 101 mmol/L (ref 96–106)
Creatinine, Ser: 0.72 mg/dL (ref 0.57–1.00)
GFR calc Af Amer: 114 mL/min/{1.73_m2} (ref >59)
GFR calc non Af Amer: 99 mL/min/{1.73_m2} (ref >59)
Globulin, Total: 3.2 g/dL (ref 1.5–4.5)
Glucose: 119 mg/dL — ABNORMAL HIGH (ref 65–99)
Potassium: 3.9 mmol/L (ref 3.5–5.2)
Sodium: 139 mmol/L (ref 134–144)
Total Protein: 7.3 g/dL (ref 6.0–8.5)

## 2020-01-27 LAB — CBC WITH DIFFERENTIAL/PLATELET
Basophils Absolute: 0.1 10*3/uL (ref 0.0–0.2)
Basos: 1 %
EOS (ABSOLUTE): 0.2 10*3/uL (ref 0.0–0.4)
Eos: 3 %
Hematocrit: 37.3 % (ref 34.0–46.6)
Hemoglobin: 11.3 g/dL (ref 11.1–15.9)
Immature Grans (Abs): 0 10*3/uL (ref 0.0–0.1)
Immature Granulocytes: 0 %
Lymphocytes Absolute: 1.9 10*3/uL (ref 0.7–3.1)
Lymphs: 31 %
MCH: 22.8 pg — ABNORMAL LOW (ref 26.6–33.0)
MCHC: 30.3 g/dL — ABNORMAL LOW (ref 31.5–35.7)
MCV: 75 fL — ABNORMAL LOW (ref 79–97)
Monocytes Absolute: 0.5 10*3/uL (ref 0.1–0.9)
Monocytes: 7 %
Neutrophils Absolute: 3.7 10*3/uL (ref 1.4–7.0)
Neutrophils: 58 %
Platelets: 222 10*3/uL (ref 150–450)
RBC: 4.96 x10E6/uL (ref 3.77–5.28)
RDW: 15.5 % — ABNORMAL HIGH (ref 11.7–15.4)
WBC: 6.3 10*3/uL (ref 3.4–10.8)

## 2020-01-27 LAB — LIPID PANEL
Chol/HDL Ratio: 3.9 ratio (ref 0.0–4.4)
Cholesterol, Total: 153 mg/dL (ref 100–199)
HDL: 39 mg/dL — ABNORMAL LOW (ref >39)
LDL Chol Calc (NIH): 88 mg/dL (ref 0–99)
Triglycerides: 147 mg/dL (ref 0–149)
VLDL Cholesterol Cal: 26 mg/dL (ref 5–40)

## 2020-01-27 LAB — HEMOGLOBIN A1C
Est. average glucose Bld gHb Est-mCnc: 123 mg/dL
Hgb A1c MFr Bld: 5.9 % — ABNORMAL HIGH (ref 4.8–5.6)

## 2020-01-27 LAB — HIV ANTIBODY (ROUTINE TESTING W REFLEX): HIV Screen 4th Generation wRfx: NONREACTIVE

## 2020-01-27 LAB — VITAMIN D 25 HYDROXY (VIT D DEFICIENCY, FRACTURES): Vit D, 25-Hydroxy: 42.5 ng/mL (ref 30.0–100.0)

## 2020-01-27 LAB — TSH: TSH: 1.1 u[IU]/mL (ref 0.450–4.500)

## 2020-02-05 ENCOUNTER — Other Ambulatory Visit: Payer: Self-pay | Admitting: Family Medicine

## 2020-02-05 DIAGNOSIS — E559 Vitamin D deficiency, unspecified: Secondary | ICD-10-CM

## 2020-02-05 NOTE — Telephone Encounter (Signed)
Requested medication (s) are due for refill today: yes  Requested medication (s) are on the active medication list: yes  Last refill:  01/04/2020  Future visit scheduled: no  Notes to clinic:   50,000 IU strengths are not delegated  Requested Prescriptions  Pending Prescriptions Disp Refills   Vitamin D, Ergocalciferol, (DRISDOL) 1.25 MG (50000 UNIT) CAPS capsule [Pharmacy Med Name: VITAMIN D2 (ERGO) 1.25MG   CAP] 12 capsule 0    Sig: Take 1 capsule by mouth once a week      Endocrinology:  Vitamins - Vitamin D Supplementation Failed - 02/05/2020 11:02 AM      Failed - 50,000 IU strengths are not delegated      Failed - Phosphate in normal range and within 360 days    No results found for: PHOS        Passed - Ca in normal range and within 360 days    Calcium  Date Value Ref Range Status  01/26/2020 9.6 8.7 - 10.2 mg/dL Final          Passed - Vitamin D in normal range and within 360 days    Vit D, 25-Hydroxy  Date Value Ref Range Status  01/26/2020 42.5 30.0 - 100.0 ng/mL Final    Comment:    Vitamin D deficiency has been defined by the Institute of Medicine and an Endocrine Society practice guideline as a level of serum 25-OH vitamin D less than 20 ng/mL (1,2). The Endocrine Society went on to further define vitamin D insufficiency as a level between 21 and 29 ng/mL (2). 1. IOM (Institute of Medicine). 2010. Dietary reference    intakes for calcium and D. [place]: The    [employer]. 2. Holick MF, Binkley [place], Bischoff-Ferrari HA, et al.    Evaluation, treatment, and prevention of vitamin D    deficiency: an Endocrine Society clinical practice    guideline. JCEM. 2011 Jul; 96(7):1911-30.           Passed - Valid encounter within last 12 months    Recent Outpatient Visits           3 weeks ago Diabetes mellitus with coincident hypertension [facility])   [facility] Birdie Sons, MD   5 months ago Diabetes mellitus with coincident  hypertension [facility])   [facility] Birdie Sons, MD   9 months ago Diabetes mellitus with coincident hypertension [facility])   [facility] Birdie Sons, MD   1 year ago Essential hypertension   [facility] Birdie Sons, MD   1 year ago Diabetes mellitus with coincident hypertension [facility])   [facility] Birdie Sons, MD

## 2020-02-07 ENCOUNTER — Telehealth: Payer: Self-pay

## 2020-02-07 NOTE — Telephone Encounter (Signed)
Tried calling patient. Left message to call back. OK for [facility] triage to advise of results and schedule 4 month diabetes follow up appointment.

## 2020-02-07 NOTE — Telephone Encounter (Signed)
-----   Message from Birdie Sons, MD sent at 02/07/2020 12:47 PM EDT ----- Labs all normal. A1c is good at 5.9. vitamin D levels are good. Continue current medications.  Schedule follow up for diabetes about 4 months.

## 2020-02-07 NOTE — Telephone Encounter (Signed)
Pt. Given results, verbalizes understanding. 

## 2020-02-08 ENCOUNTER — Other Ambulatory Visit: Payer: Self-pay | Admitting: Family Medicine

## 2020-02-08 DIAGNOSIS — N946 Dysmenorrhea, unspecified: Secondary | ICD-10-CM

## 2020-02-09 MED ORDER — HYDROCODONE-ACETAMINOPHEN 5-325 MG PO TABS: 1.0000 | ORAL_TABLET | Freq: Four times a day (QID) | ORAL | 0 refills | Status: DC | PRN

## 2020-02-15 ENCOUNTER — Encounter: Payer: Self-pay | Admitting: Family Medicine

## 2020-02-16 NOTE — Telephone Encounter (Signed)
She can 2 sample pens.

## 2020-02-20 ENCOUNTER — Other Ambulatory Visit: Payer: Self-pay

## 2020-02-20 MED ORDER — OZEMPIC (1 MG/DOSE) 2 MG/1.5ML ~~LOC~~ SOPN: 1.0000 mg | PEN_INJECTOR | SUBCUTANEOUS | 0 refills | Status: DC

## 2020-03-08 ENCOUNTER — Other Ambulatory Visit: Payer: Self-pay | Admitting: Family Medicine

## 2020-03-08 DIAGNOSIS — E039 Hypothyroidism, unspecified: Secondary | ICD-10-CM

## 2020-03-08 NOTE — Telephone Encounter (Signed)
Requested Prescriptions  Pending Prescriptions Disp Refills  . EUTHYROX 200 MCG tablet [Pharmacy Med Name: Euthyrox 200 MCG Oral Tablet] 90 tablet 3    Sig: TAKE 1 TABLET BY MOUTH ONCE DAILY BEFORE BREAKFAST     Endocrinology:  Hypothyroid Agents Failed - 03/08/2020 12:53 PM      Failed - TSH needs to be rechecked within 3 months after an abnormal result. Refill until TSH is due.      Passed - TSH in normal range and within 360 days    TSH  Date Value Ref Range Status  01/26/2020 1.100 0.450 - 4.500 uIU/mL Final         Passed - Valid encounter within last 12 months    Recent Outpatient Visits          1 month ago Diabetes mellitus with coincident hypertension [facility])   [facility] Birdie Sons, MD   6 months ago Diabetes mellitus with coincident hypertension [facility])   [facility] Birdie Sons, MD   10 months ago Diabetes mellitus with coincident hypertension [facility])   [facility] Birdie Sons, MD   1 year ago Essential hypertension   [facility] Birdie Sons, MD   1 year ago Diabetes mellitus with coincident hypertension [facility])   [facility] Birdie Sons, MD      Future Appointments            In 2 months Fisher, Kirstie Peri, MD [facility] [place]

## 2020-03-20 ENCOUNTER — Other Ambulatory Visit: Payer: Self-pay | Admitting: Family Medicine

## 2020-03-20 DIAGNOSIS — N946 Dysmenorrhea, unspecified: Secondary | ICD-10-CM

## 2020-03-20 MED ORDER — HYDROCODONE-ACETAMINOPHEN 5-325 MG PO TABS: 1.0000 | ORAL_TABLET | Freq: Four times a day (QID) | ORAL | 0 refills | Status: DC | PRN

## 2020-03-22 ENCOUNTER — Encounter: Payer: Self-pay | Admitting: Family Medicine

## 2020-03-30 ENCOUNTER — Encounter: Payer: Self-pay | Admitting: Family Medicine

## 2020-04-01 ENCOUNTER — Telehealth: Payer: Self-pay | Admitting: Family Medicine

## 2020-04-01 ENCOUNTER — Other Ambulatory Visit: Payer: Self-pay | Admitting: Family Medicine

## 2020-04-01 DIAGNOSIS — I10 Essential (primary) hypertension: Secondary | ICD-10-CM

## 2020-04-01 NOTE — Telephone Encounter (Signed)
Attempted to contact patient, no answer left a voicemail. Okay for [facility] triage to advise patient there is one ozempic sample saved for her and also get more information about her patient message regarding elevated enzymes.

## 2020-04-01 NOTE — Telephone Encounter (Signed)
Patient sent mychart message requested Ozempic 0.25/0.5 samples. She can have one sample pen if we have one. Also, she mentioned something about her muscle enzymes being high. Please see what that was about.

## 2020-04-02 NOTE — Telephone Encounter (Signed)
Tried calling patient. Left message to call back. OK for [facility] triage to advise of message.

## 2020-04-02 NOTE — Telephone Encounter (Signed)
Attempted to call patient with response from office- left message to call back on voice mail.

## 2020-04-02 NOTE — Telephone Encounter (Signed)
Message read to pt, verbalizes understanding. 

## 2020-04-02 NOTE — Telephone Encounter (Signed)
OK, she can put the Lipitor on hold for 10 days, let me know at the end of next week if it's better or not.

## 2020-04-02 NOTE — Telephone Encounter (Signed)
Pt. Given message. States she has had some cramping in her legs and "feels like I can't get my muscles stretched out. I think it's coming from the Lipitor." Please advise pt.

## 2020-04-12 ENCOUNTER — Telehealth: Payer: Self-pay | Admitting: Family Medicine

## 2020-04-12 NOTE — Telephone Encounter (Signed)
Patient was to put Lipitor on hold last week due to muscle pains. Please see if this has improved since stopping medication. If so we will change to something else

## 2020-04-12 NOTE — Telephone Encounter (Signed)
I called and spoke with patient. She states her muscle pains have improved some since stopping Lipitor. She says she feels the muscle pain is about 25% better since stopping the medication.

## 2020-04-16 ENCOUNTER — Other Ambulatory Visit: Payer: Self-pay | Admitting: Family Medicine

## 2020-04-16 DIAGNOSIS — N946 Dysmenorrhea, unspecified: Secondary | ICD-10-CM

## 2020-04-16 DIAGNOSIS — I10 Essential (primary) hypertension: Secondary | ICD-10-CM

## 2020-04-16 MED ORDER — ROSUVASTATIN CALCIUM 5 MG PO TABS: 5.0000 mg | ORAL_TABLET | Freq: Every day | ORAL | 3 refills | Status: DC

## 2020-04-16 NOTE — Telephone Encounter (Signed)
Advised 

## 2020-04-16 NOTE — Telephone Encounter (Signed)
Ok, have sent prescription for lowest dose of rosuvastatin which is often better tolerated. Should also take OTC Coenzyme Q10 100mg  once a day with the rosuvastatin.

## 2020-04-17 MED ORDER — HYDROCODONE-ACETAMINOPHEN 5-325 MG PO TABS: 1.0000 | ORAL_TABLET | Freq: Four times a day (QID) | ORAL | 0 refills | Status: DC | PRN

## 2020-04-17 MED ORDER — LISINOPRIL 10 MG PO TABS: 10.0000 mg | ORAL_TABLET | Freq: Every day | ORAL | 3 refills | Status: DC

## 2020-04-19 ENCOUNTER — Telehealth: Payer: Self-pay

## 2020-04-19 NOTE — Telephone Encounter (Signed)
We are out of Ozempic samples.  Patient says she takes the [place] on Fridays, so she just took her last dose today. She is unable to afford a prescription at the pharmacy due to the price running about $400. Please advise on recommendations.

## 2020-04-19 NOTE — Telephone Encounter (Signed)
Copied from [place] [id number]. Topic: Quick Communication - See Telephone Encounter >> Apr 19, 2020 11:42 AM Erick Blinks wrote: Best contact: [phone] Pt called and is requesting samples of Ozempic, she is completely out.

## 2020-04-23 NOTE — Telephone Encounter (Signed)
Pt called and is requesting to know if any samples have been brought in. Please advise.

## 2020-04-29 NOTE — Telephone Encounter (Signed)
Patient advised and verbalized understanding. Samples placed in the refrigerator for patient pick up.

## 2020-04-29 NOTE — Telephone Encounter (Signed)
We got some of the 0.25/0.5mg  samples in she can have two of those. Will need to take two injections of the 0.5mg  dose to make 1mg  per week.

## 2020-05-02 ENCOUNTER — Encounter: Payer: Self-pay | Admitting: Family Medicine

## 2020-05-02 DIAGNOSIS — M62838 Other muscle spasm: Secondary | ICD-10-CM

## 2020-05-06 MED ORDER — METAXALONE 800 MG PO TABS: 800.0000 mg | ORAL_TABLET | Freq: Three times a day (TID) | ORAL | 1 refills | Status: DC | PRN

## 2020-05-17 ENCOUNTER — Encounter: Payer: Self-pay | Admitting: Family Medicine

## 2020-05-17 ENCOUNTER — Other Ambulatory Visit: Payer: Self-pay | Admitting: Family Medicine

## 2020-05-17 DIAGNOSIS — N946 Dysmenorrhea, unspecified: Secondary | ICD-10-CM

## 2020-05-17 MED ORDER — HYDROCODONE-ACETAMINOPHEN 5-325 MG PO TABS: 1.0000 | ORAL_TABLET | Freq: Four times a day (QID) | ORAL | 0 refills | Status: DC | PRN

## 2020-05-21 NOTE — Telephone Encounter (Signed)
There's a bag with two samples of Ozempic for Karen Estrada in the refrigerator. I sent her a MyChart message for her to come and pick up them up.

## 2020-05-24 ENCOUNTER — Other Ambulatory Visit: Payer: Self-pay

## 2020-05-24 ENCOUNTER — Ambulatory Visit (INDEPENDENT_AMBULATORY_CARE_PROVIDER_SITE_OTHER): Payer: 59 | Admitting: Family Medicine

## 2020-05-24 ENCOUNTER — Encounter: Payer: Self-pay | Admitting: Family Medicine

## 2020-05-24 DIAGNOSIS — M791 Myalgia, unspecified site: Secondary | ICD-10-CM

## 2020-05-24 DIAGNOSIS — E119 Type 2 diabetes mellitus without complications: Secondary | ICD-10-CM | POA: Diagnosis not present

## 2020-05-24 DIAGNOSIS — I10 Essential (primary) hypertension: Secondary | ICD-10-CM

## 2020-05-24 DIAGNOSIS — Z23 Encounter for immunization: Secondary | ICD-10-CM

## 2020-05-24 LAB — POCT GLYCOSYLATED HEMOGLOBIN (HGB A1C)
Est. average glucose Bld gHb Est-mCnc: 120
Hemoglobin A1C: 5.8 % — AB (ref 4.0–5.6)

## 2020-05-24 MED ORDER — ONETOUCH VERIO VI STRP: ORAL_STRIP | 4 refills | Status: AC

## 2020-05-24 MED ORDER — ONETOUCH DELICA LANCING DEV MISC: 4 refills | Status: AC

## 2020-05-24 NOTE — Patient Instructions (Signed)
.   Please review the attached list of medications and notify my office if there are any errors.   . Please bring all of your medications to every appointment so we can make sure that our medication list is the same as yours.   

## 2020-05-24 NOTE — Progress Notes (Signed)
I,April Miller,acting as a scribe for Lelon Huh, MD.,have documented all relevant documentation on the behalf of Lelon Huh, MD,as directed by  Lelon Huh, MD while in the presence of Lelon Huh, MD.   Established patient visit   Patient: Karen Estrada   DOB: 10-Oct-1970   49 y.o. Female  MRN: [id number] Visit Date: 05/24/2020  Today's healthcare provider: Lelon Huh, MD   No chief complaint on file.  Subjective    HPI  Diabetes Mellitus Type II, follow-up  Lab Results  Component Value Date   HGBA1C 5.9 (H) 01/26/2020   HGBA1C 5.9 (A) 09/01/2019   HGBA1C 6.2 (A) 04/28/2019   Last seen for diabetes 4 months ago.  Management since then includes; Doing well with 81m Ozempic weekly. Given 2 sample pens of the 0.25/0.5 dose, and coupon with printed prescription for the 116mpen. She reports good compliance with treatment. She is not having side effects. none  Home blood sugar records: fasting range: 120-130  Episodes of hypoglycemia? No none   Current insulin regiment: Ozempic   --------------------------------------------------------------------  Her main complaint today is pain and cramping in her legs for the last month. She was advise to stop atorvastatin which she did, and to change to rosuvastatin. However she never started rosuvastatin since the pains never improved. [facility] the [facility]. Pain keeps her up at night, but dose not get any better when standing or walking. Has had no injuries.       Medications: Outpatient Medications Prior to Visit  Medication Sig  . Coenzyme Q10 (COQ10) 100 MG CAPS Take 1 tablet by mouth daily.  . EUTHYROX 200 MCG tablet TAKE 1 TABLET BY MOUTH ONCE DAILY BEFORE BREAKFAST  . glucose blood (ONETOUCH VERIO) test strip Use as instructed to check sugar three times daily for type 2 diabetes E11.9  . HYDROcodone-acetaminophen (NORCO/VICODIN)  5-325 MG tablet Take 1 tablet by mouth every 6 (six) hours as needed for moderate pain.  . Elmore Guiseevices (ONE TOUCH DELICA LANCING DEV) MISC Use to check sugar three times daily for type 2 diabetes E11.9  . lisinopril (ZESTRIL) 10 MG tablet Take 1 tablet (10 mg total) by mouth daily.  . metaxalone (SKELAXIN) 800 MG tablet Take 1 tablet (800 mg total) by mouth 3 (three) times daily as needed for muscle spasms.  . metFORMIN (GLUCOPHAGE) 1000 MG tablet TAKE 1/2 (ONE-HALF) TABLET BY MOUTH IN THE MORNING AND 1 IN THE EVENING  . Milk Thistle 140 MG CAPS Take 1 capsule by mouth daily.   . naproxen (NAPROSYN) 500 MG tablet TAKE 1 TABLET BY MOUTH TWICE DAILY AS NEEDED  . omeprazole (PRILOSEC) 20 MG capsule Take 20 mg by mouth daily.   . ondansetron (ZOFRAN) 4 MG tablet TAKE 1 TABLET BY MOUTH EVERY 8 HOURS AS NEEDED FOR NAUSEA  . promethazine (PHENERGAN) 25 MG tablet TAKE 1 TABLET BY MOUTH EVERY 6 HOURS AS NEEDED  . rosuvastatin (CRESTOR) 5 MG tablet Take 1 tablet (5 mg total) by mouth daily.  . Semaglutide, 1 MG/DOSE, (OZEMPIC, 1 MG/DOSE,) 2 MG/1.5ML SOPN Inject 0.75 mLs (1 mg total) into the skin once a week.  . sertraline (ZOLOFT) 50 MG tablet Take 1 tablet (50 mg total) by mouth daily.  . Marland Kitchenriamcinolone cream (KENALOG) 0.5 % APPLY CREAM TOPICALLY TO AFFECTED AREA TWICE DAILY, AS DIRECTED  . valACYclovir (VALTREX) 1000 MG tablet 2 tablets onset of cold sore,  take 2 more tablets twelve hours later.  . Vitamin D, Ergocalciferol, (DRISDOL) 1.25 MG (50000 UNIT) CAPS capsule Take 1 capsule by mouth once a week  . zolpidem (AMBIEN) 10 MG tablet Take 1 tablet (10 mg total) by mouth at bedtime as needed. for sleep   No facility-administered medications prior to visit.    Review of Systems  Constitutional: Negative for appetite change, chills, fatigue and fever.  Respiratory: Negative for chest tightness and shortness of breath.   Cardiovascular: Negative for chest pain and palpitations.  Gastrointestinal:  Negative for abdominal pain, nausea and vomiting.  Neurological: Negative for dizziness and weakness.      Objective    There were no vitals taken for this visit.   Physical Exam   General appearance: Severely obese female, cooperative and in no acute distress Head: Normocephalic, without obvious abnormality, atraumatic Respiratory: Respirations even and unlabored, normal respiratory rate Extremities: All extremities are intact. Both lower LEs diffusely tender with swelling of muscles of anterior lateral and posterior lower legs. Pedal pulses +2. 1+ bilateral pitting edema. No calf tenderness. Negative Homan's sign.    Results for orders placed or performed in visit on 05/24/20  POCT glycosylated hemoglobin (Hb A1C)  Result Value Ref Range   Hemoglobin A1C 5.8 (A) 4.0 - 5.6 %   Est. average glucose Bld gHb Est-mCnc 120     Assessment & Plan     1. Diabetes mellitus with coincident hypertension ([place]) Very well controlled. Continue current medications.   - glucose blood (ONETOUCH VERIO) test strip; Use as instructed to check sugar three times daily for type 2 diabetes E11.9  Dispense: 100 each; Refill: 4 - Lancet Devices (ONE TOUCH DELICA LANCING DEV) MISC; Use to check sugar three times daily for type 2 diabetes E11.9  Dispense: 100 each; Refill: 4  2. Myalgia No relief with metaxalone and no improvement since stopping statins.  - CK - Sed Rate (ESR) - Comprehensive metabolic panel - Ferritin - ANA Direct w/Reflex if Positive  3. Essential hypertension Up today secondary to leg pains.   4. Flu vaccine need  - Flu Vaccine QUAD 36+ mos IM   No follow-ups on file.      The entirety of the information documented in the History of Present Illness, Review of Systems and Physical Exam were personally obtained by me. Portions of this information were initially documented by the CMA and reviewed by me for thoroughness and accuracy.      Lelon Huh, MD  [facility] [phone] (phone) [phone] (fax)  [place]

## 2020-05-25 LAB — COMPREHENSIVE METABOLIC PANEL
ALT: 19 IU/L (ref 0–32)
AST: 22 IU/L (ref 0–40)
Albumin/Globulin Ratio: 1.1 — ABNORMAL LOW (ref 1.2–2.2)
Albumin: 4 g/dL (ref 3.8–4.8)
Alkaline Phosphatase: 116 IU/L (ref 44–121)
BUN/Creatinine Ratio: 17 (ref 9–23)
BUN: 12 mg/dL (ref 6–24)
Bilirubin Total: 0.3 mg/dL (ref 0.0–1.2)
CO2: 23 mmol/L (ref 20–29)
Calcium: 9.5 mg/dL (ref 8.7–10.2)
Chloride: 101 mmol/L (ref 96–106)
Creatinine, Ser: 0.72 mg/dL (ref 0.57–1.00)
GFR calc Af Amer: 114 mL/min/{1.73_m2} (ref >59)
GFR calc non Af Amer: 99 mL/min/{1.73_m2} (ref >59)
Globulin, Total: 3.6 g/dL (ref 1.5–4.5)
Glucose: 125 mg/dL — ABNORMAL HIGH (ref 65–99)
Potassium: 3.9 mmol/L (ref 3.5–5.2)
Sodium: 141 mmol/L (ref 134–144)
Total Protein: 7.6 g/dL (ref 6.0–8.5)

## 2020-05-25 LAB — CK: Total CK: 409 U/L — ABNORMAL HIGH (ref 32–182)

## 2020-05-25 LAB — ANA W/REFLEX IF POSITIVE: Anti Nuclear Antibody (ANA): NEGATIVE

## 2020-05-25 LAB — FERRITIN: Ferritin: 29 ng/mL (ref 15–150)

## 2020-05-25 LAB — SEDIMENTATION RATE: Sed Rate: 119 mm/hr — ABNORMAL HIGH (ref 0–32)

## 2020-05-26 ENCOUNTER — Other Ambulatory Visit: Payer: Self-pay | Admitting: Family Medicine

## 2020-05-26 DIAGNOSIS — M791 Myalgia, unspecified site: Secondary | ICD-10-CM

## 2020-05-26 DIAGNOSIS — R700 Elevated erythrocyte sedimentation rate: Secondary | ICD-10-CM

## 2020-05-26 DIAGNOSIS — R748 Abnormal levels of other serum enzymes: Secondary | ICD-10-CM

## 2020-05-26 MED ORDER — PREDNISONE 10 MG PO TABS: ORAL_TABLET | ORAL | 0 refills | Status: DC

## 2020-05-31 ENCOUNTER — Ambulatory Visit: Payer: Self-pay | Admitting: Family Medicine

## 2020-06-21 ENCOUNTER — Encounter: Payer: Self-pay | Admitting: Family Medicine

## 2020-06-21 ENCOUNTER — Other Ambulatory Visit: Payer: Self-pay | Admitting: Family Medicine

## 2020-06-21 DIAGNOSIS — N946 Dysmenorrhea, unspecified: Secondary | ICD-10-CM

## 2020-06-21 MED ORDER — HYDROCODONE-ACETAMINOPHEN 5-325 MG PO TABS: 1.0000 | ORAL_TABLET | Freq: Four times a day (QID) | ORAL | 0 refills | Status: DC | PRN

## 2020-06-25 MED ORDER — OZEMPIC (1 MG/DOSE) 2 MG/1.5ML ~~LOC~~ SOPN
1.0000 mg | PEN_INJECTOR | SUBCUTANEOUS | 12 refills | Status: DC
Start: 2020-06-25 — End: 2020-06-26

## 2020-06-26 MED ORDER — OZEMPIC (1 MG/DOSE) 2 MG/1.5ML ~~LOC~~ SOPN
1.0000 mg | PEN_INJECTOR | SUBCUTANEOUS | 12 refills | Status: DC
Start: 2020-06-26 — End: 2020-12-18

## 2020-06-27 ENCOUNTER — Other Ambulatory Visit: Payer: Self-pay | Admitting: Family Medicine

## 2020-06-27 DIAGNOSIS — R700 Elevated erythrocyte sedimentation rate: Secondary | ICD-10-CM

## 2020-06-27 DIAGNOSIS — M62838 Other muscle spasm: Secondary | ICD-10-CM

## 2020-06-27 DIAGNOSIS — R748 Abnormal levels of other serum enzymes: Secondary | ICD-10-CM

## 2020-06-27 DIAGNOSIS — M791 Myalgia, unspecified site: Secondary | ICD-10-CM

## 2020-06-27 NOTE — Telephone Encounter (Signed)
Requested medication (s) are due for refill today: yes , skelaxin, prednisone not on med list   Requested medication (s) are on the active medication list: yes: skelaxin no: prednisone  Last refill:  05/06/20 #30 1 refill  Future visit scheduled: no  Notes to clinic:  not delegated per protocol, prednisone request not on med list. Do you want to renew prednisone ?     Requested Prescriptions  Pending Prescriptions Disp Refills   metaxalone (SKELAXIN) 800 MG tablet [Pharmacy Med Name: Metaxalone 800 MG Oral Tablet] 30 tablet 0    Sig: Take 1 tablet by mouth three times daily as needed for muscle spasm      Not Delegated - Analgesics:  Muscle Relaxants Failed - 06/27/2020  2:42 PM      Failed - This refill cannot be delegated      Passed - Valid encounter within last 6 months    Recent Outpatient Visits           1 month ago Diabetes mellitus with coincident hypertension [facility])   [facility] Birdie Sons, MD   5 months ago Diabetes mellitus with coincident hypertension [facility])   [facility] Birdie Sons, MD   10 months ago Diabetes mellitus with coincident hypertension [facility])   [facility] Birdie Sons, MD   1 year ago Diabetes mellitus with coincident hypertension [facility])   [facility] Birdie Sons, MD   1 year ago Essential hypertension   [place], Kirstie Peri, MD                  predniSONE (DELTASONE) 10 MG tablet [Pharmacy Med Name: predniSONE 10 MG Oral Tablet] 60 tablet 0    Sig: TAKE 4 TABLETS DAILY FOR 3 DAYS. THEN TAKE 3 TABLETS DAILY FOR 3 DAYS. THEN 2 TABLETS DAILY.      Not Delegated - Endocrinology:  Oral Corticosteroids Failed - 06/27/2020  2:42 PM      Failed - This refill cannot be delegated      Failed - Last BP in normal range    BP Readings from Last 1 Encounters:  01/12/20 (!) 165/89          Passed - Valid encounter within last 6 months    Recent  Outpatient Visits           1 month ago Diabetes mellitus with coincident hypertension [facility])   [facility] Birdie Sons, MD   5 months ago Diabetes mellitus with coincident hypertension [facility])   [facility] Birdie Sons, MD   10 months ago Diabetes mellitus with coincident hypertension [facility])   [facility] Birdie Sons, MD   1 year ago Diabetes mellitus with coincident hypertension [facility])   [facility] Birdie Sons, MD   1 year ago Essential hypertension   [place], Kirstie Peri, MD

## 2020-07-18 ENCOUNTER — Other Ambulatory Visit: Payer: Self-pay | Admitting: Family Medicine

## 2020-07-18 DIAGNOSIS — N946 Dysmenorrhea, unspecified: Secondary | ICD-10-CM

## 2020-07-18 MED ORDER — HYDROCODONE-ACETAMINOPHEN 5-325 MG PO TABS: 1.0000 | ORAL_TABLET | Freq: Four times a day (QID) | ORAL | 0 refills | Status: DC | PRN

## 2020-07-28 ENCOUNTER — Other Ambulatory Visit: Payer: Self-pay | Admitting: Family Medicine

## 2020-07-31 ENCOUNTER — Encounter: Payer: Self-pay | Admitting: Family Medicine

## 2020-08-02 ENCOUNTER — Other Ambulatory Visit: Payer: Self-pay | Admitting: Family Medicine

## 2020-08-02 DIAGNOSIS — R748 Abnormal levels of other serum enzymes: Secondary | ICD-10-CM

## 2020-08-02 DIAGNOSIS — M791 Myalgia, unspecified site: Secondary | ICD-10-CM

## 2020-08-02 DIAGNOSIS — R700 Elevated erythrocyte sedimentation rate: Secondary | ICD-10-CM

## 2020-08-02 NOTE — Telephone Encounter (Signed)
Requested medication (s) are due for refill today: no  Requested medication (s) are on the active medication list: yes  Last refill:  07/01/2020  Future visit scheduled: no  Notes to clinic:  this refill cannot be delegated   Requested Prescriptions  Pending Prescriptions Disp Refills   predniSONE (DELTASONE) 10 MG tablet [Pharmacy Med Name: predniSONE 10 MG Oral Tablet] 60 tablet 0    Sig: TAKE 4 TABLETS BY MOUTH DAILY FOR 3 DAYS, THEN DECREASE TO 3 TABLETS DAILY FOR 3 DAYS, THEN 2 TABLETS DAILY.      Not Delegated - Endocrinology:  Oral Corticosteroids Failed - 08/02/2020  1:26 PM      Failed - This refill cannot be delegated      Failed - Last BP in normal range    BP Readings from Last 1 Encounters:  01/12/20 (!) 165/89          Passed - Valid encounter within last 6 months    Recent Outpatient Visits           2 months ago Diabetes mellitus with coincident hypertension ([place])   [facility] Birdie Sons, MD   6 months ago Diabetes mellitus with coincident hypertension [facility])   [facility] Birdie Sons, MD   11 months ago Diabetes mellitus with coincident hypertension [facility])   [facility] Birdie Sons, MD   1 year ago Diabetes mellitus with coincident hypertension [facility])   [facility] Birdie Sons, MD   1 year ago Essential hypertension   [facility] Birdie Sons, MD                 Signed Prescriptions Disp Refills   rosuvastatin (CRESTOR) 5 MG tablet 30 tablet 0    Sig: Take 1 tablet by mouth once daily      Cardiovascular:  Antilipid - Statins Failed - 08/02/2020  1:26 PM      Failed - LDL in normal range and within 360 days    LDL Chol Calc (NIH)  Date Value Ref Range Status  01/26/2020 88 0 - 99 mg/dL Final          Failed - HDL in normal range and within 360 days    HDL  Date Value Ref Range Status  01/26/2020 39 (L) >39 mg/dL Final          Passed -  Total Cholesterol in normal range and within 360 days    Cholesterol, Total  Date Value Ref Range Status  01/26/2020 153 100 - 199 mg/dL Final          Passed - Triglycerides in normal range and within 360 days    Triglycerides  Date Value Ref Range Status  01/26/2020 147 0 - 149 mg/dL Final          Passed - Patient is not pregnant      Passed - Valid encounter within last 12 months    Recent Outpatient Visits           2 months ago Diabetes mellitus with coincident hypertension [facility])   [facility] Birdie Sons, MD   6 months ago Diabetes mellitus with coincident hypertension [facility])   [facility] Birdie Sons, MD   11 months ago Diabetes mellitus with coincident hypertension [facility])   [facility] Birdie Sons, MD   1 year ago Diabetes mellitus with coincident hypertension [facility])   [facility],  Kirstie Peri, MD   1 year ago Essential hypertension   [place], Kirstie Peri, MD

## 2020-08-02 NOTE — Telephone Encounter (Signed)
Occasionally, but not very often.

## 2020-08-05 ENCOUNTER — Encounter: Payer: Self-pay | Admitting: Family Medicine

## 2020-08-07 ENCOUNTER — Other Ambulatory Visit: Payer: Self-pay

## 2020-08-10 ENCOUNTER — Other Ambulatory Visit: Payer: Self-pay | Admitting: Family Medicine

## 2020-08-10 NOTE — Telephone Encounter (Signed)
Requested Prescriptions  Pending Prescriptions Disp Refills  . rosuvastatin (CRESTOR) 5 MG tablet [Pharmacy Med Name: Rosuvastatin Calcium 5 MG Oral Tablet] 90 tablet 1    Sig: Take 1 tablet by mouth once daily     Cardiovascular:  Antilipid - Statins Failed - 08/10/2020 12:18 PM      Failed - LDL in normal range and within 360 days    LDL Chol Calc (NIH)  Date Value Ref Range Status  01/26/2020 88 0 - 99 mg/dL Final         Failed - HDL in normal range and within 360 days    HDL  Date Value Ref Range Status  01/26/2020 39 (L) >39 mg/dL Final         Passed - Total Cholesterol in normal range and within 360 days    Cholesterol, Total  Date Value Ref Range Status  01/26/2020 153 100 - 199 mg/dL Final         Passed - Triglycerides in normal range and within 360 days    Triglycerides  Date Value Ref Range Status  01/26/2020 147 0 - 149 mg/dL Final         Passed - Patient is not pregnant      Passed - Valid encounter within last 12 months    Recent Outpatient Visits          2 months ago Diabetes mellitus with coincident hypertension [facility])   [facility] Birdie Sons, MD   7 months ago Diabetes mellitus with coincident hypertension [facility])   [facility] Birdie Sons, MD   11 months ago Diabetes mellitus with coincident hypertension [facility])   [facility] Birdie Sons, MD   1 year ago Diabetes mellitus with coincident hypertension [facility])   [facility] Birdie Sons, MD   1 year ago Essential hypertension   [place], Kirstie Peri, MD

## 2020-08-15 ENCOUNTER — Other Ambulatory Visit: Payer: Self-pay | Admitting: Family Medicine

## 2020-08-15 DIAGNOSIS — N946 Dysmenorrhea, unspecified: Secondary | ICD-10-CM

## 2020-08-16 MED ORDER — HYDROCODONE-ACETAMINOPHEN 5-325 MG PO TABS: 1.0000 | ORAL_TABLET | Freq: Four times a day (QID) | ORAL | 0 refills | Status: DC | PRN

## 2020-08-29 ENCOUNTER — Encounter: Payer: Self-pay | Admitting: Family Medicine

## 2020-09-03 LAB — FECAL OCCULT BLOOD, GUAIAC: Fecal Occult Blood: NEGATIVE

## 2020-09-15 ENCOUNTER — Encounter: Payer: Self-pay | Admitting: Family Medicine

## 2020-09-15 ENCOUNTER — Other Ambulatory Visit: Payer: Self-pay | Admitting: Family Medicine

## 2020-09-15 DIAGNOSIS — N946 Dysmenorrhea, unspecified: Secondary | ICD-10-CM

## 2020-09-16 MED ORDER — HYDROCODONE-ACETAMINOPHEN 5-325 MG PO TABS: 1.0000 | ORAL_TABLET | Freq: Four times a day (QID) | ORAL | 0 refills | Status: DC | PRN

## 2020-09-20 ENCOUNTER — Encounter: Payer: Self-pay | Admitting: Family Medicine

## 2020-09-22 ENCOUNTER — Other Ambulatory Visit: Payer: Self-pay | Admitting: Family Medicine

## 2020-09-22 DIAGNOSIS — M791 Myalgia, unspecified site: Secondary | ICD-10-CM

## 2020-09-22 DIAGNOSIS — R748 Abnormal levels of other serum enzymes: Secondary | ICD-10-CM

## 2020-09-22 DIAGNOSIS — R700 Elevated erythrocyte sedimentation rate: Secondary | ICD-10-CM

## 2020-09-22 NOTE — Telephone Encounter (Signed)
Requested medication (s) are due for refill today: -  Requested medication (s) are on the active medication list: yes  Last refill:  08/02/20  Future visit scheduled: no  Notes to clinic:  Med not delegated to NT to RF   Requested Prescriptions  Pending Prescriptions Disp Refills   predniSONE (DELTASONE) 10 MG tablet [Pharmacy Med Name: predniSONE 10 MG Oral Tablet] 60 tablet 0    Sig: Take 2 tablets by mouth once daily      Not Delegated - Endocrinology:  Oral Corticosteroids Failed - 09/22/2020  4:37 AM      Failed - This refill cannot be delegated      Failed - Last BP in normal range    BP Readings from Last 1 Encounters:  01/12/20 (!) 165/89          Passed - Valid encounter within last 6 months    Recent Outpatient Visits           4 months ago Diabetes mellitus with coincident hypertension [facility])   [facility], MD   8 months ago Diabetes mellitus with coincident hypertension [facility])   [facility] Bird[facility] Sons, MD   1 year ago Diabetes mellitus with coincident hypertension [facility])   [facility] Birdie Sons, MD   1 year ago Diabetes mellitus with coincident hypertension [facility])   [facility] Birdie Sons, MD   1 year ago Essential hypertension   [place], Kirstie Peri, MD

## 2020-10-10 ENCOUNTER — Encounter: Payer: Self-pay | Admitting: Family Medicine

## 2020-10-13 ENCOUNTER — Other Ambulatory Visit: Payer: Self-pay | Admitting: Family Medicine

## 2020-10-13 DIAGNOSIS — M791 Myalgia, unspecified site: Secondary | ICD-10-CM

## 2020-10-13 DIAGNOSIS — R700 Elevated erythrocyte sedimentation rate: Secondary | ICD-10-CM

## 2020-10-13 DIAGNOSIS — R748 Abnormal levels of other serum enzymes: Secondary | ICD-10-CM

## 2020-10-13 NOTE — Telephone Encounter (Signed)
Last RF 09/23/20 with note that pt needs to make appointment for further refills. MyChart message sent to pt to call office to make appointment. Phone number provided. Med not delegated to NT to RF. Requested Prescriptions  Pending Prescriptions Disp Refills  . predniSONE (DELTASONE) 10 MG tablet [Pharmacy Med Name: predniSONE 10 MG Oral Tablet] 60 tablet 0    Sig: TAKE 1 TO 2 TABLETS BY MOUTH ONCE DAILY . APPOINTMENT REQUIRED FOR FUTURE REFILLS     Not Delegated - Endocrinology:  Oral Corticosteroids Failed - 10/13/2020  1:32 PM      Failed - This refill cannot be delegated      Failed - Last BP in normal range    BP Readings from Last 1 Encounters:  01/12/20 (!) 165/89         Passed - Valid encounter within last 6 months    Recent Outpatient Visits          4 months ago Diabetes mellitus with coincident hypertension [facility])   [facility] Bird[facility], [facility]   9 months ago Diabetes mellitus with coincident hypertension [facility])   [facility] Birdie Sons, MD   1 year ago Diabetes mellitus with coincident hypertension [facility])   [facility] Birdie Sons, MD   1 year ago Diabetes mellitus with coincident hypertension [facility])   [facility] Birdie Sons, MD   1 year ago Essential hypertension   [place], Kirstie Peri, MD

## 2020-10-15 ENCOUNTER — Other Ambulatory Visit: Payer: Self-pay | Admitting: Family Medicine

## 2020-10-15 DIAGNOSIS — N946 Dysmenorrhea, unspecified: Secondary | ICD-10-CM

## 2020-10-15 DIAGNOSIS — M62838 Other muscle spasm: Secondary | ICD-10-CM

## 2020-10-15 NOTE — Telephone Encounter (Signed)
Requested medication (s) are due for refill today: yes  Requested medication (s) are on the active medication list:yes  Last refill: 07/01/20  #30   0 refills  Future visit scheduled yes   11/01/20  Notes to clinic: not delegated  Requested Prescriptions  Pending Prescriptions Disp Refills   metaxalone (SKELAXIN) 800 MG tablet [Pharmacy Med Name: Metaxalone 800 MG Oral Tablet] 30 tablet 0    Sig: Take 1 tablet by mouth three times daily as needed for muscle spasm      Not Delegated - Analgesics:  Muscle Relaxants Failed - 10/15/2020 12:30 PM      Failed - This refill cannot be delegated      Passed - Valid encounter within last 6 months    Recent Outpatient Visits           4 months ago Diabetes mellitus with coincident hypertension [facility])   [facility] Birdie Sons, MD   9 months ago Diabetes mellitus with coincident hypertension [facility])   [facility] Birdie Sons, MD   1 year ago Diabetes mellitus with coincident hypertension [facility])   [facility] Birdie Sons, MD   1 year ago Diabetes mellitus with coincident hypertension [facility])   [facility] Birdie Sons, MD   1 year ago Essential hypertension   [place], Kirstie Peri, MD       Future Appointments             In 2 weeks [place], Kirstie Peri, MD [facility] [place]

## 2020-10-16 MED ORDER — HYDROCODONE-ACETAMINOPHEN 5-325 MG PO TABS: 1.0000 | ORAL_TABLET | Freq: Four times a day (QID) | ORAL | 0 refills | Status: DC | PRN

## 2020-10-18 ENCOUNTER — Encounter: Payer: Self-pay | Admitting: Family Medicine

## 2020-11-01 ENCOUNTER — Ambulatory Visit (INDEPENDENT_AMBULATORY_CARE_PROVIDER_SITE_OTHER): Payer: 59 | Admitting: Family Medicine

## 2020-11-01 ENCOUNTER — Other Ambulatory Visit: Payer: Self-pay

## 2020-11-01 ENCOUNTER — Encounter: Payer: Self-pay | Admitting: Family Medicine

## 2020-11-01 VITALS — BP 149/72 | HR 84 | Temp 99.0°F | Resp 16 | Ht 63.0 in | Wt 339.0 lb

## 2020-11-01 DIAGNOSIS — I10 Essential (primary) hypertension: Secondary | ICD-10-CM | POA: Diagnosis not present

## 2020-11-01 DIAGNOSIS — M353 Polymyalgia rheumatica: Secondary | ICD-10-CM | POA: Diagnosis not present

## 2020-11-01 DIAGNOSIS — G720 Drug-induced myopathy: Secondary | ICD-10-CM

## 2020-11-01 DIAGNOSIS — E119 Type 2 diabetes mellitus without complications: Secondary | ICD-10-CM | POA: Diagnosis not present

## 2020-11-01 DIAGNOSIS — T466X5A Adverse effect of antihyperlipidemic and antiarteriosclerotic drugs, initial encounter: Secondary | ICD-10-CM

## 2020-11-01 LAB — POCT GLYCOSYLATED HEMOGLOBIN (HGB A1C)
Est. average glucose Bld gHb Est-mCnc: 134
Hemoglobin A1C: 6.3 % — AB (ref 4.0–5.6)

## 2020-11-01 MED ORDER — EZETIMIBE 10 MG PO TABS
10.0000 mg | ORAL_TABLET | Freq: Every day | ORAL | 3 refills | Status: DC
Start: 2020-11-01 — End: 2021-03-30

## 2020-11-01 NOTE — Progress Notes (Signed)
Established patient visit   Patient: Karen Estrada   DOB: [birth year]   50 y.o. Female  MRN: [id number] Visit Date: 11/01/2020  Today's healthcare provider: Lelon Huh, MD   Chief Complaint  Patient presents with  . Diabetes  . Hypertension   Subjective    HPI  Diabetes Mellitus Type II, Follow-up  Lab Results  Component Value Date   HGBA1C 5.8 (A) 05/24/2020   HGBA1C 5.9 (H) 01/26/2020   HGBA1C 5.9 (A) 09/01/2019   Wt Readings from Last 3 Encounters:  11/01/20 (!) 339 lb (153.8 kg)  01/12/20 (!) 341 lb (154.7 kg)  09/01/19 (!) 348 lb 12.8 oz (158.2 kg)   Last seen for diabetes 5 months ago.  Management since then includes no medication changes. Good control.  She reports good compliance with treatment. She is not having side effects.  Symptoms: No fatigue No foot ulcerations  No appetite changes No nausea  No paresthesia of the feet  No polydipsia  No polyuria No visual disturbances   No vomiting     Home blood sugar records: trend: stable per pt.  Episodes of hypoglycemia? No    Current insulin regiment: none Most Recent Eye Exam: not UTD Current exercise: no regular exercise Current diet habits: well balanced  Pertinent Labs: Lab Results  Component Value Date   CHOL 153 01/26/2020   HDL 39 (L) 01/26/2020   LDLCALC 88 01/26/2020   TRIG 147 01/26/2020   CHOLHDL 3.9 01/26/2020   Lab Results  Component Value Date   NA 141 05/24/2020   K 3.9 05/24/2020   CREATININE 0.72 05/24/2020   GFRNONAA 99 05/24/2020   GFRAA 114 05/24/2020   GLUCOSE 125 (H) 05/24/2020     --------------------------------------------------------------------------------------------------- Hypertension, follow-up  BP Readings from Last 3 Encounters:  11/01/20 (!) 149/72  01/12/20 (!) 165/89  09/01/19 (!) 141/84   Wt Readings from Last 3 Encounters:  11/01/20 (!) 339 lb (153.8 kg)  01/12/20 (!) 341 lb (154.7 kg)  09/01/19 (!) 348 lb 12.8 oz (158.2 kg)      She was last seen for hypertension 5 months ago.  BP at that visit was 165/89. Management since that visit includes no medication changes. Continue to monitor BP at home.   She reports good compliance with treatment. She is not having side effects.  She is following a Regular diet. She is not exercising. She does not smoke.  Use of agents associated with hypertension: none.   Outside blood pressures are checked occasionally. Symptoms: No chest pain No chest pressure  No palpitations No syncope  No dyspnea No orthopnea  No paroxysmal nocturnal dyspnea Yes lower extremity edema   Pertinent labs: Lab Results  Component Value Date   CHOL 153 01/26/2020   HDL 39 (L) 01/26/2020   LDLCALC 88 01/26/2020   TRIG 147 01/26/2020   CHOLHDL 3.9 01/26/2020   Lab Results  Component Value Date   NA 141 05/24/2020   K 3.9 05/24/2020   CREATININE 0.72 05/24/2020   GFRNONAA 99 05/24/2020   GFRAA 114 05/24/2020   GLUCOSE 125 (H) 05/24/2020     The 10-year ASCVD risk score Mikey Bussing DC Jr., et al., 2013) is: 4.6%   Lipid/Cholesterol, Follow-up  Last lipid panel Other pertinent labs  Lab Results  Component Value Date   CHOL 153 01/26/2020   HDL 39 (L) 01/26/2020   LDLCALC 88 01/26/2020   TRIG 147 01/26/2020   CHOLHDL 3.9 01/26/2020   Lab Results  Component Value Date   ALT 19 05/24/2020   AST 22 05/24/2020   PLT 222 01/26/2020   TSH 1.100 01/26/2020     She was last seen for this 5 months ago.  Management since that visit includes no medication changes. However, patient reports that she is no longer taking rosuvastatin 5mg .     She reports poor compliance with treatment. She is having side effects. Patient reports that she stopped medication due to muscle aches and cramps.    Follow up PMR.   Her last sed rate in December was 119. She states she does very well when she takes 20mg  of prednisone a day, but muscle pains become severe within two days when she cuts back to 10mg .  Her sugars have ben running slightly higher on prednisone.      Medications: Outpatient Medications Prior to Visit  Medication Sig  . Coenzyme Q10 (COQ10) 100 MG CAPS Take 1 tablet by mouth daily.  . EUTHYROX 200 MCG tablet TAKE 1 TABLET BY MOUTH ONCE DAILY BEFORE BREAKFAST  . glucose blood (ONETOUCH VERIO) test strip Use as instructed to check sugar three times daily for type 2 diabetes E11.9  . HYDROcodone-acetaminophen (NORCO/VICODIN) 5-325 MG tablet Take 1 tablet by mouth every 6 (six) hours as needed for moderate pain.  [employer] (ONE TOUCH DELICA LANCING DEV) MISC Use to check sugar three times daily for type 2 diabetes E11.9  . lisinopril (ZESTRIL) 10 MG tablet Take 1 tablet (10 mg total) by mouth daily.  . metaxalone (SKELAXIN) 800 MG tablet Take 1 tablet by mouth three times daily as needed for muscle spasm  . metFORMIN (GLUCOPHAGE) 1000 MG tablet TAKE 1/2 (ONE-HALF) TABLET BY MOUTH IN THE MORNING AND 1 IN THE EVENING  . Milk Thistle 140 MG CAPS Take 1 capsule by mouth daily.   . naproxen (NAPROSYN) 500 MG tablet TAKE 1 TABLET BY MOUTH TWICE DAILY AS NEEDED  . omeprazole (PRILOSEC) 20 MG capsule Take 20 mg by mouth daily.   . ondansetron (ZOFRAN) 4 MG tablet TAKE 1 TABLET BY MOUTH EVERY 8 HOURS AS NEEDED FOR NAUSEA  . predniSONE (DELTASONE) 10 MG tablet Take 1-2 tablets (10-20 mg total) by mouth daily.  . promethazine (PHENERGAN) 25 MG tablet TAKE 1 TABLET BY MOUTH EVERY 6 HOURS AS NEEDED  . Semaglutide, 1 MG/DOSE, (OZEMPIC, 1 MG/DOSE,) 2 MG/1.5ML SOPN Inject 1 mg into the skin once a week.  . sertraline (ZOLOFT) 50 MG tablet Take 1 tablet (50 mg total) by mouth daily.  Marland Kitchen triamcinolone cream (KENALOG) 0.5 % APPLY CREAM TOPICALLY TO AFFECTED AREA TWICE DAILY, AS DIRECTED  . valACYclovir (VALTREX) 1000 MG tablet 2 tablets onset of cold sore, take 2 more tablets twelve hours later.  . Vitamin D, Ergocalciferol, (DRISDOL) 1.25 MG (50000 UNIT) CAPS capsule Take 1 capsule by mouth  once a week  . zolpidem (AMBIEN) 10 MG tablet Take 1 tablet (10 mg total) by mouth at bedtime as needed. for sleep  . rosuvastatin (CRESTOR) 5 MG tablet Take 1 tablet by mouth once daily (Patient not taking: Reported on 11/01/2020)   No facility-administered medications prior to visit.    Review of Systems  Constitutional: Negative for chills and fatigue.  Respiratory: Negative for cough and shortness of breath.   Cardiovascular: Negative for chest pain, palpitations and leg swelling.  Musculoskeletal: Negative for arthralgias and myalgias.  Neurological: Negative for dizziness, light-headedness and headaches.  Psychiatric/Behavioral: Negative for agitation, self-injury, sleep disturbance and suicidal ideas.  The patient is not nervous/anxious.        Objective    BP (!) 149/72   Pulse 84   Temp 99 F (37.2 C)   Resp 16   Ht 5\' 3"  (1.6 m)   Wt (!) 339 lb (153.8 kg)   BMI 60.05 kg/m     Physical Exam   General: Appearance:    Severely obese female in no acute distress  Eyes:    PERRL, conjunctiva/corneas clear, EOM's intact       Lungs:     Clear to auscultation bilaterally, respirations unlabored  Heart:    Normal heart rate. Normal rhythm. No murmurs, rubs, or gallops.   MS:   All extremities are intact.   Neurologic:   Awake, alert, oriented x 3. No apparent focal neurological           defect.        Results for orders placed or performed in visit on 11/01/20  POCT glycosylated hemoglobin (Hb A1C)  Result Value Ref Range   Hemoglobin A1C 6.3 (A) 4.0 - 5.6 %   Est. average glucose Bld gHb Est-mCnc 134      Assessment & Plan     1. Diabetes mellitus with coincident hypertension (HCC) A1c is up since last visit. She is tolerating Ozempic much better the Trulicity but is still [phone] dollars a month with insurance after [employer]. Will try to get more samples.   2. Polymyalgia rheumatica (HCC) Well controlled on 20mg  prednisone. Counseled to continue to  periodically attempt to wean dose in small increments.    3. Essential hypertension Continue current dose of lisinopril. Consider increasing or adding a thiazide if not better at follow up when she be having labs drawn.   4. Statin myopathy Intolerant to multiple statins. Will try- ezetimibe (ZETIA) 10 MG tablet; Take 1 tablet (10 mg total) by mouth daily.  Dispense: 90 tablet; Refill: 3    Future Appointments  Date Time Provider [place]  03/07/2021  4:00 PM Caryn Section, Kirstie Peri, MD [facility]        The entirety of the information documented in the History of Present Illness, Review of Systems and Physical Exam were personally obtained by me. Portions of this information were initially documented by the CMA and reviewed by me for thoroughness and accuracy.      Lelon Huh, MD  [facility] [phone] (phone) [phone] (fax)  [place]

## 2020-11-06 ENCOUNTER — Other Ambulatory Visit: Payer: Self-pay | Admitting: Family Medicine

## 2020-11-06 DIAGNOSIS — M791 Myalgia, unspecified site: Secondary | ICD-10-CM

## 2020-11-06 DIAGNOSIS — R748 Abnormal levels of other serum enzymes: Secondary | ICD-10-CM

## 2020-11-06 DIAGNOSIS — R700 Elevated erythrocyte sedimentation rate: Secondary | ICD-10-CM

## 2020-11-06 NOTE — Telephone Encounter (Signed)
Requested medication (s) are due for refill today - unsure  Requested medication (s) are on the active medication list -yes  Future visit scheduled -yes  Last refill: 10/16/20  Notes to clinic: Request RF- non delegated Rx  Requested Prescriptions  Pending Prescriptions Disp Refills   predniSONE (DELTASONE) 10 MG tablet [Pharmacy Med Name: predniSONE 10 MG Oral Tablet] 30 tablet 0    Sig: TAKE 1 TO 2 TABLETS BY MOUTH ONCE DAILY      Not Delegated - Endocrinology:  Oral Corticosteroids Failed - 11/06/2020 10:12 AM      Failed - This refill cannot be delegated      Failed - Last BP in normal range    BP Readings from Last 1 Encounters:  11/01/20 (!) 149/72          Passed - Valid encounter within last 6 months    Recent Outpatient Visits           5 days ago Diabetes mellitus with coincident hypertension ([place])   [facility] Birdie Sons, MD   5 months ago Diabetes mellitus with coincident hypertension [facility])   [facility] Birdie Sons, MD   9 months ago Diabetes mellitus with coincident hypertension [facility])   [facility] Birdie Sons, MD   1 year ago Diabetes mellitus with coincident hypertension [facility])   [facility] Birdie Sons, MD   1 year ago Diabetes mellitus with coincident hypertension [facility])   [place], Kirstie Peri, MD       Future Appointments             In 4 months [place], Kirstie Peri, MD [facility]                 Requested Prescriptions  Pending Prescriptions Disp Refills   predniSONE (DELTASONE) 10 MG tablet [Pharmacy Med Name: predniSONE 10 MG Oral Tablet] 30 tablet 0    Sig: TAKE 1 TO 2 TABLETS BY MOUTH ONCE DAILY      Not Delegated - Endocrinology:  Oral Corticosteroids Failed - 11/06/2020 10:12 AM      Failed - This refill cannot be delegated      Failed - Last BP in normal range    BP Readings from Last 1 Encounters:  11/01/20  (!) 149/72          Passed - Valid encounter within last 6 months    Recent Outpatient Visits           5 days ago Diabetes mellitus with coincident hypertension [facility])   [facility] Birdie Sons, MD   5 months ago Diabetes mellitus with coincident hypertension [facility])   [facility] Birdie Sons, MD   9 months ago Diabetes mellitus with coincident hypertension [facility])   [facility] Birdie Sons, MD   1 year ago Diabetes mellitus with coincident hypertension [facility])   [facility] Birdie Sons, MD   1 year ago Diabetes mellitus with coincident hypertension [facility])   [facility] Birdie Sons, MD       Future Appointments             In 4 months Fisher, Kirstie Peri, MD [facility] [place]

## 2020-11-07 ENCOUNTER — Other Ambulatory Visit: Payer: Self-pay | Admitting: Family Medicine

## 2020-11-07 DIAGNOSIS — M62838 Other muscle spasm: Secondary | ICD-10-CM

## 2020-11-14 ENCOUNTER — Other Ambulatory Visit: Payer: Self-pay | Admitting: Family Medicine

## 2020-11-14 DIAGNOSIS — N946 Dysmenorrhea, unspecified: Secondary | ICD-10-CM

## 2020-11-14 MED ORDER — HYDROCODONE-ACETAMINOPHEN 5-325 MG PO TABS: 1.0000 | ORAL_TABLET | Freq: Four times a day (QID) | ORAL | 0 refills | Status: DC | PRN

## 2020-11-20 ENCOUNTER — Telehealth: Payer: Self-pay | Admitting: Family Medicine

## 2020-11-20 NOTE — Telephone Encounter (Signed)
Please advise patient we do have two sample pens of Ozempic for her in the refrigerator.

## 2020-11-20 NOTE — Telephone Encounter (Signed)
Patient advised.

## 2020-12-13 ENCOUNTER — Encounter: Payer: Self-pay | Admitting: Family Medicine

## 2020-12-13 ENCOUNTER — Other Ambulatory Visit: Payer: Self-pay | Admitting: Family Medicine

## 2020-12-13 DIAGNOSIS — I10 Essential (primary) hypertension: Secondary | ICD-10-CM

## 2020-12-13 DIAGNOSIS — E119 Type 2 diabetes mellitus without complications: Secondary | ICD-10-CM

## 2020-12-13 DIAGNOSIS — N946 Dysmenorrhea, unspecified: Secondary | ICD-10-CM

## 2020-12-13 MED ORDER — HYDROCODONE-ACETAMINOPHEN 5-325 MG PO TABS: 1.0000 | ORAL_TABLET | Freq: Four times a day (QID) | ORAL | 0 refills | Status: DC | PRN

## 2020-12-18 ENCOUNTER — Other Ambulatory Visit: Payer: Self-pay | Admitting: Family Medicine

## 2020-12-18 DIAGNOSIS — M62838 Other muscle spasm: Secondary | ICD-10-CM

## 2020-12-18 MED ORDER — SEMAGLUTIDE (2 MG/DOSE) 8 MG/3ML ~~LOC~~ SOPN: 2.0000 mg | PEN_INJECTOR | SUBCUTANEOUS | 0 refills | Status: DC

## 2020-12-19 ENCOUNTER — Encounter: Payer: Self-pay | Admitting: Family Medicine

## 2021-01-17 ENCOUNTER — Other Ambulatory Visit: Payer: Self-pay | Admitting: Family Medicine

## 2021-01-17 DIAGNOSIS — M791 Myalgia, unspecified site: Secondary | ICD-10-CM

## 2021-01-17 DIAGNOSIS — N946 Dysmenorrhea, unspecified: Secondary | ICD-10-CM

## 2021-01-17 DIAGNOSIS — R700 Elevated erythrocyte sedimentation rate: Secondary | ICD-10-CM

## 2021-01-17 DIAGNOSIS — R748 Abnormal levels of other serum enzymes: Secondary | ICD-10-CM

## 2021-01-17 NOTE — Telephone Encounter (Signed)
Requested medications are due for refill today yes  Requested medications are on the active medication list yes  Last refill 7/18 (for 30 but can take 1 or 2)  Last visit 11/01/20  Future visit scheduled 03/07/21  Notes to clinic Not Delegated.

## 2021-01-19 MED ORDER — HYDROCODONE-ACETAMINOPHEN 5-325 MG PO TABS: 1.0000 | ORAL_TABLET | Freq: Four times a day (QID) | ORAL | 0 refills | Status: DC | PRN

## 2021-02-03 ENCOUNTER — Other Ambulatory Visit: Payer: Self-pay | Admitting: Family Medicine

## 2021-02-03 DIAGNOSIS — R700 Elevated erythrocyte sedimentation rate: Secondary | ICD-10-CM

## 2021-02-03 DIAGNOSIS — R748 Abnormal levels of other serum enzymes: Secondary | ICD-10-CM

## 2021-02-03 DIAGNOSIS — M791 Myalgia, unspecified site: Secondary | ICD-10-CM

## 2021-02-03 NOTE — Telephone Encounter (Signed)
Requested medications are due for refill today unsure  Requested medications are on the active medication list yes  Last refill 01/19/21  Last visit 11/01/20  Future visit scheduled 03/07/21  Notes to clinic Not Delegated.

## 2021-02-04 ENCOUNTER — Other Ambulatory Visit: Payer: Self-pay | Admitting: Family Medicine

## 2021-02-04 DIAGNOSIS — I10 Essential (primary) hypertension: Secondary | ICD-10-CM

## 2021-02-04 DIAGNOSIS — E039 Hypothyroidism, unspecified: Secondary | ICD-10-CM

## 2021-02-04 NOTE — Telephone Encounter (Signed)
Notes to clinic:  Patient has upcoming appt on 03/07/2021 Review for refill until that time    Requested Prescriptions  Pending Prescriptions Disp Refills   EUTHYROX 200 MCG tablet [Pharmacy Med Name: Euthyrox 200 MCG Oral Tablet] 90 tablet 0    Sig: TAKE 1 TABLET BY MOUTH ONCE DAILY BEFORE BREAKFAST     Endocrinology:  Hypothyroid Agents Failed - 02/04/2021 10:11 AM      Failed - TSH needs to be rechecked within 3 months after an abnormal result. Refill until TSH is due.      Failed - TSH in normal range and within 360 days    TSH  Date Value Ref Range Status  01/26/2020 1.100 0.450 - 4.500 uIU/mL Final          Passed - Valid encounter within last 12 months    Recent Outpatient Visits           3 months ago Diabetes mellitus with coincident hypertension [facility])   [facility] Birdie Sons, MD   8 months ago Diabetes mellitus with coincident hypertension [facility])   [facility] Birdie Sons, MD   1 year ago Diabetes mellitus with coincident hypertension [facility])   [facility] Birdie Sons, MD   1 year ago Diabetes mellitus with coincident hypertension [facility])   [facility] Birdie Sons, MD   1 year ago Diabetes mellitus with coincident hypertension [facility])   [facility] Birdie Sons, MD       Future Appointments             In 1 month Fisher, Kirstie Peri, MD [facility]             lisinopril (ZESTRIL) 10 MG tablet [Pharmacy Med Name: Lisinopril 10 MG Oral Tablet] 90 tablet 0    Sig: Take 1 tablet by mouth once daily     Cardiovascular:  ACE Inhibitors Failed - 02/04/2021 10:11 AM      Failed - Cr in normal range and within 180 days    Creatinine, Ser  Date Value Ref Range Status  05/24/2020 0.72 0.57 - 1.00 mg/dL Final          Failed - K in normal range and within 180 days    Potassium  Date Value Ref Range Status  05/24/2020 3.9 3.5 - 5.2 mmol/L Final           Failed - Last BP in normal range    BP Readings from Last 1 Encounters:  11/01/20 (!) 149/72          Passed - Patient is not pregnant      Passed - Valid encounter within last 6 months    Recent Outpatient Visits           3 months ago Diabetes mellitus with coincident hypertension ([place])   [facility] Birdie Sons, MD   8 months ago Diabetes mellitus with coincident hypertension [facility])   [facility] Birdie Sons, MD   1 year ago Diabetes mellitus with coincident hypertension [facility])   [facility] Birdie Sons, MD   1 year ago Diabetes mellitus with coincident hypertension [facility])   [facility] Birdie Sons, MD   1 year ago Diabetes mellitus with coincident hypertension [facility])   [facility] Birdie Sons, MD       Future Appointments  In 1 month Fisher, Kirstie Peri, MD [facility] [place]

## 2021-02-17 ENCOUNTER — Other Ambulatory Visit: Payer: Self-pay | Admitting: Family Medicine

## 2021-02-17 DIAGNOSIS — N946 Dysmenorrhea, unspecified: Secondary | ICD-10-CM

## 2021-02-17 DIAGNOSIS — R748 Abnormal levels of other serum enzymes: Secondary | ICD-10-CM

## 2021-02-17 DIAGNOSIS — M791 Myalgia, unspecified site: Secondary | ICD-10-CM

## 2021-02-17 DIAGNOSIS — R700 Elevated erythrocyte sedimentation rate: Secondary | ICD-10-CM

## 2021-02-17 MED ORDER — HYDROCODONE-ACETAMINOPHEN 5-325 MG PO TABS: 1.0000 | ORAL_TABLET | Freq: Four times a day (QID) | ORAL | 0 refills | Status: DC | PRN

## 2021-02-17 MED ORDER — PREDNISONE 10 MG PO TABS: 10.0000 mg | ORAL_TABLET | Freq: Every day | ORAL | 0 refills | Status: DC

## 2021-02-21 ENCOUNTER — Encounter: Payer: Self-pay | Admitting: Family Medicine

## 2021-03-05 ENCOUNTER — Other Ambulatory Visit: Payer: Self-pay | Admitting: Family Medicine

## 2021-03-05 DIAGNOSIS — R748 Abnormal levels of other serum enzymes: Secondary | ICD-10-CM

## 2021-03-05 DIAGNOSIS — M791 Myalgia, unspecified site: Secondary | ICD-10-CM

## 2021-03-05 DIAGNOSIS — R700 Elevated erythrocyte sedimentation rate: Secondary | ICD-10-CM

## 2021-03-05 NOTE — Telephone Encounter (Signed)
Requested medication (s) are due for refill today - no  Requested medication (s) are on the active medication list -yes  Future visit scheduled -yes  Last refill: 02/17/21 #30  Notes to clinic: Request RF: non delegated Rx  Requested Prescriptions  Pending Prescriptions Disp Refills   predniSONE (DELTASONE) 10 MG tablet [Pharmacy Med Name: predniSONE 10 MG Oral Tablet] 30 tablet 0    Sig: Take 1 tablet by mouth once daily     Not Delegated - Endocrinology:  Oral Corticosteroids Failed - 03/05/2021  8:09 AM      Failed - This refill cannot be delegated      Failed - Last BP in normal range    BP Readings from Last 1 Encounters:  11/01/20 (!) 149/72          Passed - Valid encounter within last 6 months    Recent Outpatient Visits           4 months ago Diabetes mellitus with coincident hypertension ([place])   [facility] Birdie Sons, MD   9 months ago Diabetes mellitus with coincident hypertension [facility])   [facility] Birdie Sons, MD   1 year ago Diabetes mellitus with coincident hypertension [facility])   [facility] Birdie Sons, MD   1 year ago Diabetes mellitus with coincident hypertension [facility])   [facility] Birdie Sons, MD   1 year ago Diabetes mellitus with coincident hypertension [facility])   [place], Kirstie Peri, MD       Future Appointments             In 2 days Fisher, Kirstie Peri, MD [facility]               Requested Prescriptions  Pending Prescriptions Disp Refills   predniSONE (DELTASONE) 10 MG tablet [Pharmacy Med Name: predniSONE 10 MG Oral Tablet] 30 tablet 0    Sig: Take 1 tablet by mouth once daily     Not Delegated - Endocrinology:  Oral Corticosteroids Failed - 03/05/2021  8:09 AM      Failed - This refill cannot be delegated      Failed - Last BP in normal range    BP Readings from Last 1 Encounters:  11/01/20 (!) 149/72           Passed - Valid encounter within last 6 months    Recent Outpatient Visits           4 months ago Diabetes mellitus with coincident hypertension [facility])   [facility] Birdie Sons, MD   9 months ago Diabetes mellitus with coincident hypertension [facility])   [facility] Birdie Sons, MD   1 year ago Diabetes mellitus with coincident hypertension [facility])   [facility] Bird[facility] Sons, MD   1 year ago Diabetes mellitus with coincident hypertension [facility])   [facility] Birdie Sons, MD   1 year ago Diabetes mellitus with coincident hypertension [facility])   [facility] Birdie Sons, MD       Future Appointments             In 2 days Fisher, Kirstie Peri, MD [facility] [place]

## 2021-03-06 MED ORDER — PREDNISONE 10 MG PO TABS: 10.0000 mg | ORAL_TABLET | Freq: Every day | ORAL | 0 refills | Status: DC

## 2021-03-06 NOTE — Telephone Encounter (Signed)
LOV: 11/01/2020 NOV: 03/07/2021   Last Refill: 02/17/2021 #30 0 Refill  Thanks,   Mickel Baas

## 2021-03-07 ENCOUNTER — Other Ambulatory Visit: Payer: Self-pay

## 2021-03-07 ENCOUNTER — Encounter: Payer: Self-pay | Admitting: Family Medicine

## 2021-03-07 ENCOUNTER — Telehealth (INDEPENDENT_AMBULATORY_CARE_PROVIDER_SITE_OTHER): Payer: 59 | Admitting: Family Medicine

## 2021-03-07 DIAGNOSIS — E119 Type 2 diabetes mellitus without complications: Secondary | ICD-10-CM

## 2021-03-07 DIAGNOSIS — I10 Essential (primary) hypertension: Secondary | ICD-10-CM

## 2021-03-07 DIAGNOSIS — E559 Vitamin D deficiency, unspecified: Secondary | ICD-10-CM | POA: Diagnosis not present

## 2021-03-07 DIAGNOSIS — R197 Diarrhea, unspecified: Secondary | ICD-10-CM | POA: Diagnosis not present

## 2021-03-07 DIAGNOSIS — G720 Drug-induced myopathy: Secondary | ICD-10-CM | POA: Diagnosis not present

## 2021-03-07 DIAGNOSIS — R112 Nausea with vomiting, unspecified: Secondary | ICD-10-CM

## 2021-03-07 DIAGNOSIS — E785 Hyperlipidemia, unspecified: Secondary | ICD-10-CM

## 2021-03-07 DIAGNOSIS — E039 Hypothyroidism, unspecified: Secondary | ICD-10-CM

## 2021-03-07 MED ORDER — ONDANSETRON HCL 4 MG PO TABS: 4.0000 mg | ORAL_TABLET | Freq: Three times a day (TID) | ORAL | 1 refills | Status: DC | PRN

## 2021-03-07 NOTE — Progress Notes (Signed)
MyChart Video Visit    Virtual Visit via Video Note   This visit type was conducted due to national recommendations for restrictions regarding the COVID-19 Pandemic (e.g. social distancing) in an effort to limit this patient's exposure and mitigate transmission in our community. This patient is at least at moderate risk for complications without adequate follow up. This format is felt to be most appropriate for this patient at this time. Physical exam was limited by quality of the video and audio technology used for the visit.   Patient location: home Provider location: bfp  I discussed the limitations of evaluation and management by telemedicine and the availability of in person appointments. The patient expressed understanding and agreed to proceed.  Patient: Karen Estrada   DOB: [birth year]   50 y.o. Female  MRN: [id number] Visit Date: 03/07/2021  Today's healthcare provider: Lelon Huh, MD   No chief complaint on file.  Subjective    HPI  Diabetes Mellitus Type II, Follow-up  Lab Results  Component Value Date   HGBA1C 6.3 (A) 11/01/2020   HGBA1C 5.8 (A) 05/24/2020   HGBA1C 5.9 (H) 01/26/2020   Wt Readings from Last 3 Encounters:  11/01/20 (!) 339 lb (153.8 kg)  01/12/20 (!) 341 lb (154.7 kg)  09/01/19 (!) 348 lb 12.8 oz (158.2 kg)   Last seen for diabetes 4 months ago.  Management since then includes ozempic. She reports good compliance with treatment. She is not having side effects. none Symptoms: No fatigue No foot ulcerations  No appetite changes No nausea  No paresthesia of the feet  No polydipsia  No polyuria No visual disturbances   No vomiting     Home blood sugar records:  none  Episodes of hypoglycemia? No    Current insulin regiment: none Most Recent Eye Exam: 6 months ago Current exercise: none Current diet habits: trying to consume diabetic diet   Pertinent Labs: Lab Results  Component Value Date   CHOL 153 01/26/2020   HDL 39 (L)  01/26/2020   LDLCALC 88 01/26/2020   TRIG 147 01/26/2020   CHOLHDL 3.9 01/26/2020   Lab Results  Component Value Date   NA 141 05/24/2020   K 3.9 05/24/2020   CREATININE 0.72 05/24/2020   GFRNONAA 99 05/24/2020   GFRAA 114 05/24/2020   GLUCOSE 125 (H) 05/24/2020     ---------------------------------------------------------------------------------------------------    Medications: Outpatient Medications Prior to Visit  Medication Sig   Coenzyme Q10 (COQ10) 100 MG CAPS Take 1 tablet by mouth daily.   EUTHYROX 200 MCG tablet TAKE 1 TABLET BY MOUTH ONCE DAILY BEFORE BREAKFAST   ezetimibe (ZETIA) 10 MG tablet Take 1 tablet (10 mg total) by mouth daily.   glucose blood (ONETOUCH VERIO) test strip Use as instructed to check sugar three times daily for type 2 diabetes E11.9   HYDROcodone-acetaminophen (NORCO/VICODIN) 5-325 MG tablet Take 1 tablet by mouth every 6 (six) hours as needed for moderate pain.   Lancet Devices (ONE TOUCH DELICA LANCING DEV) MISC Use to check sugar three times daily for type 2 diabetes E11.9   lisinopril (ZESTRIL) 10 MG tablet Take 1 tablet by mouth once daily   metaxalone (SKELAXIN) 800 MG tablet Take 1 tablet by mouth three times daily as needed for muscle spasm   metFORMIN (GLUCOPHAGE) 1000 MG tablet TAKE 1/2 (ONE-HALF) TABLET BY MOUTH IN THE MORNING AND 1 IN THE EVENING   Milk Thistle 140 MG CAPS Take 1 capsule by mouth daily.  naproxen (NAPROSYN) 500 MG tablet TAKE 1 TABLET BY MOUTH TWICE DAILY AS NEEDED   omeprazole (PRILOSEC) 20 MG capsule Take 20 mg by mouth daily.    ondansetron (ZOFRAN) 4 MG tablet TAKE 1 TABLET BY MOUTH EVERY 8 HOURS AS NEEDED FOR NAUSEA   predniSONE (DELTASONE) 10 MG tablet Take 1 tablet (10 mg total) by mouth daily.   promethazine (PHENERGAN) 25 MG tablet TAKE 1 TABLET BY MOUTH EVERY 6 HOURS AS NEEDED   Semaglutide, 2 MG/DOSE, 8 MG/3ML SOPN Inject 2 mg as directed once a week.   sertraline (ZOLOFT) 50 MG tablet Take 1 tablet (50 mg  total) by mouth daily.   triamcinolone cream (KENALOG) 0.5 % APPLY CREAM TOPICALLY TO AFFECTED AREA TWICE DAILY, AS DIRECTED   valACYclovir (VALTREX) 1000 MG tablet 2 tablets onset of cold sore, take 2 more tablets twelve hours later.   Vitamin D, Ergocalciferol, (DRISDOL) 1.25 MG (50000 UNIT) CAPS capsule Take 1 capsule by mouth once a week   zolpidem (AMBIEN) 10 MG tablet Take 1 tablet (10 mg total) by mouth at bedtime as needed. for sleep   No facility-administered medications prior to visit.        Objective    There were no vitals taken for this visit.   Physical Exam  Awake, alert, oriented x 3. In no apparent distress    Assessment & Plan     1. Diarrhea, unspecified type   2. Nausea and vomiting, intractability of vomiting not specified, unspecified vomiting type Likely viral gastroenteritis.  - ondansetron (ZOFRAN) 4 MG tablet; Take 1 tablet (4 mg total) by mouth every 8 (eight) hours as needed. for nausea  Dispense: 10 tablet; Refill: 1  Go to Er or urgent care if not improving over the weekend.   3. Statin myopathy Doing well on ezetimibe.   4. Vitamin D deficiency  - VITAMIN D 25 Hydroxy (Vit-D Deficiency, Fractures)  5. Diabetes mellitus with coincident hypertension (HCC)  - Hemoglobin A1c  6. Essential hypertension Well controlled.  Continue current medications.    7. Hyperlipidemia, unspecified hyperlipidemia type  - CBC - Lipid panel - Comprehensive metabolic panel  8. Hypothyroidism, unspecified type  - TSH   No follow-ups on file.     I discussed the assessment and treatment plan with the patient. The patient was provided an opportunity to ask questions and all were answered. The patient agreed with the plan and demonstrated an understanding of the instructions.   The patient was advised to call back or seek an in-person evaluation if the symptoms worsen or if the condition fails to improve as anticipated.  I provided 10 minutes of  non-face-to-face time during this encounter.  The entirety of the information documented in the History of Present Illness, Review of Systems and Physical Exam were personally obtained by me. Portions of this information were initially documented by the CMA and reviewed by me for thoroughness and accuracy.    Lelon Huh, MD [facility] [phone] (phone) [phone] (fax)  [place]

## 2021-03-11 ENCOUNTER — Other Ambulatory Visit: Payer: Self-pay | Admitting: Family Medicine

## 2021-03-11 DIAGNOSIS — R700 Elevated erythrocyte sedimentation rate: Secondary | ICD-10-CM

## 2021-03-11 DIAGNOSIS — R748 Abnormal levels of other serum enzymes: Secondary | ICD-10-CM

## 2021-03-11 DIAGNOSIS — M791 Myalgia, unspecified site: Secondary | ICD-10-CM

## 2021-03-11 MED ORDER — PREDNISONE 10 MG PO TABS: 10.0000 mg | ORAL_TABLET | Freq: Every day | ORAL | 0 refills | Status: DC

## 2021-03-22 LAB — COMPREHENSIVE METABOLIC PANEL
ALT: 32 IU/L (ref 0–32)
AST: 25 IU/L (ref 0–40)
Albumin/Globulin Ratio: 1.6 (ref 1.2–2.2)
Albumin: 4.2 g/dL (ref 3.8–4.8)
Alkaline Phosphatase: 94 IU/L (ref 44–121)
BUN/Creatinine Ratio: 14 (ref 9–23)
BUN: 10 mg/dL (ref 6–24)
Bilirubin Total: 0.3 mg/dL (ref 0.0–1.2)
CO2: 22 mmol/L (ref 20–29)
Calcium: 9.4 mg/dL (ref 8.7–10.2)
Chloride: 103 mmol/L (ref 96–106)
Creatinine, Ser: 0.69 mg/dL (ref 0.57–1.00)
Globulin, Total: 2.7 g/dL (ref 1.5–4.5)
Glucose: 98 mg/dL (ref 70–99)
Potassium: 3.8 mmol/L (ref 3.5–5.2)
Sodium: 141 mmol/L (ref 134–144)
Total Protein: 6.9 g/dL (ref 6.0–8.5)
eGFR: 106 mL/min/{1.73_m2} (ref >59)

## 2021-03-22 LAB — TSH: TSH: 5.41 u[IU]/mL — ABNORMAL HIGH (ref 0.450–4.500)

## 2021-03-22 LAB — LIPID PANEL
Chol/HDL Ratio: 4.5 ratio — ABNORMAL HIGH (ref 0.0–4.4)
Cholesterol, Total: 282 mg/dL — ABNORMAL HIGH (ref 100–199)
HDL: 62 mg/dL (ref >39)
LDL Chol Calc (NIH): 184 mg/dL — ABNORMAL HIGH (ref 0–99)
Triglycerides: 191 mg/dL — ABNORMAL HIGH (ref 0–149)
VLDL Cholesterol Cal: 36 mg/dL (ref 5–40)

## 2021-03-22 LAB — CBC
Hematocrit: 37.5 % (ref 34.0–46.6)
Hemoglobin: 12.2 g/dL (ref 11.1–15.9)
MCH: 25.3 pg — ABNORMAL LOW (ref 26.6–33.0)
MCHC: 32.5 g/dL (ref 31.5–35.7)
MCV: 78 fL — ABNORMAL LOW (ref 79–97)
Platelets: 263 10*3/uL (ref 150–450)
RBC: 4.83 x10E6/uL (ref 3.77–5.28)
RDW: 14.8 % (ref 11.7–15.4)
WBC: 7.1 10*3/uL (ref 3.4–10.8)

## 2021-03-22 LAB — HEMOGLOBIN A1C
Est. average glucose Bld gHb Est-mCnc: 140 mg/dL
Hgb A1c MFr Bld: 6.5 % — ABNORMAL HIGH (ref 4.8–5.6)

## 2021-03-22 LAB — VITAMIN D 25 HYDROXY (VIT D DEFICIENCY, FRACTURES): Vit D, 25-Hydroxy: 28.6 ng/mL — ABNORMAL LOW (ref 30.0–100.0)

## 2021-03-23 ENCOUNTER — Other Ambulatory Visit: Payer: Self-pay | Admitting: Family Medicine

## 2021-03-23 DIAGNOSIS — N946 Dysmenorrhea, unspecified: Secondary | ICD-10-CM

## 2021-03-23 MED ORDER — HYDROCODONE-ACETAMINOPHEN 5-325 MG PO TABS
1.0000 | ORAL_TABLET | Freq: Four times a day (QID) | ORAL | 0 refills | Status: DC | PRN
Start: 2021-03-23 — End: 2021-04-24

## 2021-03-28 ENCOUNTER — Encounter: Payer: Self-pay | Admitting: Family Medicine

## 2021-03-28 DIAGNOSIS — E785 Hyperlipidemia, unspecified: Secondary | ICD-10-CM

## 2021-03-30 MED ORDER — COLESEVELAM HCL 625 MG PO TABS: 1875.0000 mg | ORAL_TABLET | Freq: Two times a day (BID) | ORAL | 5 refills | Status: DC

## 2021-04-02 ENCOUNTER — Encounter: Payer: Self-pay | Admitting: Family Medicine

## 2021-04-04 ENCOUNTER — Encounter: Payer: Self-pay | Admitting: Family Medicine

## 2021-04-13 ENCOUNTER — Other Ambulatory Visit: Payer: Self-pay | Admitting: Family Medicine

## 2021-04-13 DIAGNOSIS — M62838 Other muscle spasm: Secondary | ICD-10-CM

## 2021-04-14 ENCOUNTER — Encounter: Payer: Self-pay | Admitting: Family Medicine

## 2021-04-14 NOTE — Telephone Encounter (Signed)
Requested medication (s) are due for refill today:   Provider to review  Requested medication (s) are on the active medication list:   Yes  Future visit scheduled:   No   Last ordered: 12/18/2020 #30, 1 refill  Non delegated refill per protocol   Requested Prescriptions  Pending Prescriptions Disp Refills   metaxalone (SKELAXIN) 800 MG tablet [Pharmacy Med Name: Metaxalone 800 MG Oral Tablet] 30 tablet 0    Sig: Take 1 tablet by mouth three times daily as needed for muscle spasm     Not Delegated - Analgesics:  Muscle Relaxants Failed - 04/13/2021  8:03 PM      Failed - This refill cannot be delegated      Passed - Valid encounter within last 6 months    Recent Outpatient Visits           1 month ago Nausea and vomiting, intractability of vomiting not specified, unspecified vomiting type   [facility] Birdie Sons, MD   5 months ago Diabetes mellitus with coincident hypertension [facility])   [facility] Birdie Sons, MD   10 months ago Diabetes mellitus with coincident hypertension [facility])   [facility] Birdie Sons, MD   1 year ago Diabetes mellitus with coincident hypertension [facility])   [facility] Birdie Sons, MD   1 year ago Diabetes mellitus with coincident hypertension [facility])   [facility], Kirstie Peri, MD

## 2021-04-24 ENCOUNTER — Other Ambulatory Visit: Payer: Self-pay | Admitting: Family Medicine

## 2021-04-24 DIAGNOSIS — N946 Dysmenorrhea, unspecified: Secondary | ICD-10-CM

## 2021-04-25 MED ORDER — HYDROCODONE-ACETAMINOPHEN 5-325 MG PO TABS
1.0000 | ORAL_TABLET | Freq: Four times a day (QID) | ORAL | 0 refills | Status: DC | PRN
Start: 2021-04-25 — End: 2021-05-22

## 2021-04-25 NOTE — Telephone Encounter (Signed)
LOV: 03/07/2021   NOV: None   Last Refill:  03/23/2021  #30 0 Refills.   Thanks,   -Mickel Baas

## 2021-04-28 NOTE — Telephone Encounter (Signed)
She can have 2 samples if we have them.

## 2021-05-03 ENCOUNTER — Encounter: Payer: Self-pay | Admitting: Family Medicine

## 2021-05-03 ENCOUNTER — Other Ambulatory Visit: Payer: Self-pay | Admitting: Family Medicine

## 2021-05-03 DIAGNOSIS — M791 Myalgia, unspecified site: Secondary | ICD-10-CM

## 2021-05-03 DIAGNOSIS — R748 Abnormal levels of other serum enzymes: Secondary | ICD-10-CM

## 2021-05-03 DIAGNOSIS — M62838 Other muscle spasm: Secondary | ICD-10-CM

## 2021-05-03 DIAGNOSIS — R700 Elevated erythrocyte sedimentation rate: Secondary | ICD-10-CM

## 2021-05-04 MED ORDER — PREDNISONE 10 MG PO TABS: 5.0000 mg | ORAL_TABLET | Freq: Every day | ORAL | 0 refills | Status: DC

## 2021-05-04 MED ORDER — METAXALONE 800 MG PO TABS: 800.0000 mg | ORAL_TABLET | Freq: Three times a day (TID) | ORAL | 0 refills | Status: DC | PRN

## 2021-05-05 ENCOUNTER — Other Ambulatory Visit: Payer: Self-pay

## 2021-05-05 DIAGNOSIS — E559 Vitamin D deficiency, unspecified: Secondary | ICD-10-CM

## 2021-05-05 MED ORDER — VITAMIN D (ERGOCALCIFEROL) 1.25 MG (50000 UNIT) PO CAPS: 50000.0000 [IU] | ORAL_CAPSULE | ORAL | 4 refills | Status: DC

## 2021-05-22 ENCOUNTER — Other Ambulatory Visit: Payer: Self-pay | Admitting: Family Medicine

## 2021-05-22 ENCOUNTER — Encounter: Payer: Self-pay | Admitting: Family Medicine

## 2021-05-22 DIAGNOSIS — N946 Dysmenorrhea, unspecified: Secondary | ICD-10-CM

## 2021-05-23 MED ORDER — HYDROCODONE-ACETAMINOPHEN 5-325 MG PO TABS: 1.0000 | ORAL_TABLET | Freq: Four times a day (QID) | ORAL | 0 refills | Status: DC | PRN

## 2021-05-23 NOTE — Telephone Encounter (Signed)
Ok, she can have 2 sample pens.

## 2021-05-27 ENCOUNTER — Other Ambulatory Visit: Payer: Self-pay | Admitting: Family Medicine

## 2021-05-27 DIAGNOSIS — I10 Essential (primary) hypertension: Secondary | ICD-10-CM

## 2021-05-27 DIAGNOSIS — E039 Hypothyroidism, unspecified: Secondary | ICD-10-CM

## 2021-06-01 ENCOUNTER — Encounter: Payer: Self-pay | Admitting: Family Medicine

## 2021-06-04 ENCOUNTER — Other Ambulatory Visit: Payer: Self-pay | Admitting: Family Medicine

## 2021-06-04 DIAGNOSIS — R700 Elevated erythrocyte sedimentation rate: Secondary | ICD-10-CM

## 2021-06-04 DIAGNOSIS — M791 Myalgia, unspecified site: Secondary | ICD-10-CM

## 2021-06-04 DIAGNOSIS — R748 Abnormal levels of other serum enzymes: Secondary | ICD-10-CM

## 2021-06-04 NOTE — Telephone Encounter (Signed)
Requested medication (s) are due for refill today:   Provider to review  Requested medication (s) are on the active medication list:   Yes  Future visit scheduled:   No   Last ordered: 05/04/2021 #30, 0 refills  Returned because it's a non delegated refill   Requested Prescriptions  Pending Prescriptions Disp Refills   predniSONE (DELTASONE) 10 MG tablet [Pharmacy Med Name: predniSONE 10 MG Oral Tablet] 30 tablet 0    Sig: TAKE 1/2 TO 1 (ONE-HALF TO ONE) TABLET BY MOUTH ONCE DAILY     Not Delegated - Endocrinology:  Oral Corticosteroids Failed - 06/04/2021  8:01 AM      Failed - This refill cannot be delegated      Failed - Last BP in normal range    BP Readings from Last 1 Encounters:  11/01/20 (!) 149/72          Passed - Valid encounter within last 6 months    Recent Outpatient Visits           2 months ago Nausea and vomiting, intractability of vomiting not specified, unspecified vomiting type   [facility] Birdie Sons, MD   7 months ago Diabetes mellitus with coincident hypertension [facility])   [facility] Birdie Sons, MD   1 year ago Diabetes mellitus with coincident hypertension [facility])   [facility] Birdie Sons, MD   1 year ago Diabetes mellitus with coincident hypertension [facility])   [facility] Birdie Sons, MD   1 year ago Diabetes mellitus with coincident hypertension [facility])   [facility] Birdie Sons, MD              Provider to

## 2021-06-04 NOTE — Telephone Encounter (Signed)
LOV: 03/07/2021 (Video Visit)  NOV: None  Last Refill:  05/04/2021 #30 0 Refills.    Thanks,   -Mickel Baas

## 2021-06-05 ENCOUNTER — Other Ambulatory Visit: Payer: Self-pay | Admitting: Family Medicine

## 2021-06-05 DIAGNOSIS — R748 Abnormal levels of other serum enzymes: Secondary | ICD-10-CM

## 2021-06-05 DIAGNOSIS — M791 Myalgia, unspecified site: Secondary | ICD-10-CM

## 2021-06-05 DIAGNOSIS — R700 Elevated erythrocyte sedimentation rate: Secondary | ICD-10-CM

## 2021-06-06 ENCOUNTER — Encounter: Payer: Self-pay | Admitting: Family Medicine

## 2021-06-10 ENCOUNTER — Other Ambulatory Visit: Payer: Self-pay | Admitting: Family Medicine

## 2021-06-10 DIAGNOSIS — R748 Abnormal levels of other serum enzymes: Secondary | ICD-10-CM

## 2021-06-10 DIAGNOSIS — M791 Myalgia, unspecified site: Secondary | ICD-10-CM

## 2021-06-10 DIAGNOSIS — R700 Elevated erythrocyte sedimentation rate: Secondary | ICD-10-CM

## 2021-06-10 MED ORDER — PREDNISONE 10 MG PO TABS: 5.0000 mg | ORAL_TABLET | Freq: Every day | ORAL | 0 refills | Status: DC

## 2021-06-17 ENCOUNTER — Other Ambulatory Visit: Payer: Self-pay | Admitting: Family Medicine

## 2021-06-17 DIAGNOSIS — M62838 Other muscle spasm: Secondary | ICD-10-CM

## 2021-06-20 ENCOUNTER — Other Ambulatory Visit: Payer: Self-pay | Admitting: Family Medicine

## 2021-06-20 DIAGNOSIS — N946 Dysmenorrhea, unspecified: Secondary | ICD-10-CM

## 2021-06-20 MED ORDER — HYDROCODONE-ACETAMINOPHEN 5-325 MG PO TABS: 1.0000 | ORAL_TABLET | Freq: Four times a day (QID) | ORAL | 0 refills | Status: DC | PRN

## 2021-06-20 NOTE — Telephone Encounter (Signed)
LOV: 11/01/2020  NOV: 06/25/2021   Last Refill:  05/23/2021 #30 0 Refill  Thanks,   Mickel Baas

## 2021-06-24 ENCOUNTER — Telehealth: Payer: Self-pay

## 2021-06-24 NOTE — Telephone Encounter (Signed)
Shaneta from [employer] called to follow up on PA for Ozempic 4mg .  [id number] TEL: [phone]. Please call if assistance is needed with PA.

## 2021-06-25 ENCOUNTER — Other Ambulatory Visit: Payer: Self-pay

## 2021-06-25 ENCOUNTER — Ambulatory Visit (INDEPENDENT_AMBULATORY_CARE_PROVIDER_SITE_OTHER): Payer: 59 | Admitting: Family Medicine

## 2021-06-25 ENCOUNTER — Encounter: Payer: Self-pay | Admitting: Family Medicine

## 2021-06-25 VITALS — BP 137/81 | HR 78 | Temp 98.3°F | Resp 22 | Wt 334.0 lb

## 2021-06-25 DIAGNOSIS — R700 Elevated erythrocyte sedimentation rate: Secondary | ICD-10-CM

## 2021-06-25 DIAGNOSIS — E039 Hypothyroidism, unspecified: Secondary | ICD-10-CM | POA: Diagnosis not present

## 2021-06-25 DIAGNOSIS — E785 Hyperlipidemia, unspecified: Secondary | ICD-10-CM | POA: Diagnosis not present

## 2021-06-25 DIAGNOSIS — I10 Essential (primary) hypertension: Secondary | ICD-10-CM

## 2021-06-25 DIAGNOSIS — Z23 Encounter for immunization: Secondary | ICD-10-CM | POA: Diagnosis not present

## 2021-06-25 DIAGNOSIS — M353 Polymyalgia rheumatica: Secondary | ICD-10-CM

## 2021-06-25 DIAGNOSIS — E119 Type 2 diabetes mellitus without complications: Secondary | ICD-10-CM | POA: Diagnosis not present

## 2021-06-25 MED ORDER — COLESTIPOL HCL 1 G PO TABS: 1.0000 g | ORAL_TABLET | Freq: Two times a day (BID) | ORAL | 5 refills | Status: DC

## 2021-06-25 NOTE — Progress Notes (Signed)
Established patient visit   Patient: Karen Estrada   DOB: 01-29-1971   51 y.o. Female  MRN: [id number] Visit Date: 06/25/2021  Today's healthcare provider: Lelon Huh, MD   Chief Complaint  Patient presents with   Diabetes   Hypertension   Hypothyroidism   Hyperlipidemia   Subjective    HPI  Diabetes Mellitus Type II, Follow-up  Lab Results  Component Value Date   HGBA1C 6.5 (H) 03/21/2021   HGBA1C 6.3 (A) 11/01/2020   HGBA1C 5.8 (A) 05/24/2020   Wt Readings from Last 3 Encounters:  06/25/21 (!) 334 lb (151.5 kg)  11/01/20 (!) 339 lb (153.8 kg)  01/12/20 (!) 341 lb (154.7 kg)   Last seen for diabetes 3 months ago.  Management since then includes continuing same medication. She reports good compliance with treatment. She is not having side effects.  Symptoms: No fatigue No foot ulcerations  No appetite changes No nausea  No paresthesia of the feet  No polydipsia  No polyuria No visual disturbances   No vomiting     Home blood sugar records:  blood sugars are not checked   Episodes of hypoglycemia? No    Current insulin regiment: none Most Recent Eye Exam: 11/2020 ([place], [place]) Current exercise: walking Current diet habits: in general, an "unhealthy" diet   ---------------------------------------------------------------------------------------------------   Hypertension, follow-up  BP Readings from Last 3 Encounters:  06/25/21 137/81  11/01/20 (!) 149/72  01/12/20 (!) 165/89   Wt Readings from Last 3 Encounters:  06/25/21 (!) 334 lb (151.5 kg)  11/01/20 (!) 339 lb (153.8 kg)  01/12/20 (!) 341 lb (154.7 kg)     She was last seen for hypertension 3 months ago.  Management since that visit includes continuing same medication.  She reports good compliance with treatment. She is not having side effects.  She is following a Regular diet. She is exercising. She does not smoke.  Use of agents associated with hypertension:  thyroid hormones.   Outside blood pressures are not checked. Symptoms: No chest pain No chest pressure  No palpitations No syncope  No dyspnea No orthopnea  No paroxysmal nocturnal dyspnea No lower extremity edema   Pertinent labs: Lab Results  Component Value Date   NA 141 03/21/2021   K 3.8 03/21/2021   CREATININE 0.69 03/21/2021   EGFR 106 03/21/2021   GLUCOSE 98 03/21/2021   TSH 5.410 (H) 03/21/2021     The 10-year ASCVD risk score (Arnett DK, et al., 2019) is: 5%   ---------------------------------------------------------------------------------------------------   Hypothyroid, follow-up  Lab Results  Component Value Date   TSH 5.410 (H) 03/21/2021   TSH 1.100 01/26/2020   TSH 0.584 12/26/2018   T4TOTAL 9.1 09/01/2016    Wt Readings from Last 3 Encounters:  06/25/21 (!) 334 lb (151.5 kg)  11/01/20 (!) 339 lb (153.8 kg)  01/12/20 (!) 341 lb (154.7 kg)    She was last seen for hypothyroid 3 months ago.  Management since that visit includes continuing same dose of medication. She reports good compliance with treatment. She is not having side effects.   Symptoms: No change in energy level No constipation  No diarrhea No heat / cold intolerance  No nervousness No palpitations  No weight changes    -----------------------------------------------------------------------------------------   Lipid/Cholesterol, Follow-up  Last lipid panel Other pertinent labs  Lab Results  Component Value Date   CHOL 282 (H) 03/21/2021   HDL 62 03/21/2021   LDLCALC 184 (H) 03/21/2021  TRIG 191 (H) 03/21/2021   CHOLHDL 4.5 (H) 03/21/2021   Lab Results  Component Value Date   ALT 32 03/21/2021   AST 25 03/21/2021   PLT 263 03/21/2021   TSH 5.410 (H) 03/21/2021     She was last seen for this 3 months ago.  Management since that visit includes changing to [facility].  She reports poor compliance with treatment. Medication was too expensive. She is not having side  effects.   Symptoms: No chest pain No chest pressure/discomfort  No dyspnea No lower extremity edema  No numbness or tingling of extremity No orthopnea  No palpitations No paroxysmal nocturnal dyspnea  No speech difficulty No syncope   Current diet: in general, an "unhealthy" diet Current exercise: walking  The 10-year ASCVD risk score (Arnett DK, et al., 2019) is: 5%  ---------------------------------------------------------------------------------------------------   Medications: Outpatient Medications Prior to Visit  Medication Sig   Coenzyme Q10 (COQ10) 100 MG CAPS Take 1 tablet by mouth daily.   glucose blood (ONETOUCH VERIO) test strip Use as instructed to check sugar three times daily for type 2 diabetes E11.9   HYDROcodone-acetaminophen (NORCO/VICODIN) 5-325 MG tablet Take 1 tablet by mouth every 6 (six) hours as needed for moderate pain.   Lancet Devices (ONE TOUCH DELICA LANCING DEV) MISC Use to check sugar three times daily for type 2 diabetes E11.9   levothyroxine (SYNTHROID) 200 MCG tablet TAKE 1 TABLET BY MOUTH ONCE DAILY BEFORE BREAKFAST   lisinopril (ZESTRIL) 10 MG tablet Take 1 tablet by mouth once daily   metaxalone (SKELAXIN) 800 MG tablet TAKE 1 TABLET BY MOUTH THREE TIMES DAILY AS NEEDED FOR MUSCLE SPASMS   metFORMIN (GLUCOPHAGE) 1000 MG tablet TAKE 1/2 (ONE-HALF) TABLET BY MOUTH IN THE MORNING AND 1 IN THE EVENING   Milk Thistle 140 MG CAPS Take 1 capsule by mouth daily.    omeprazole (PRILOSEC) 20 MG capsule Take 20 mg by mouth daily.    ondansetron (ZOFRAN) 4 MG tablet Take 1 tablet (4 mg total) by mouth every 8 (eight) hours as needed. for nausea   predniSONE (DELTASONE) 10 MG tablet Take 0.5-1 tablets (5-10 mg total) by mouth daily.   promethazine (PHENERGAN) 25 MG tablet TAKE 1 TABLET BY MOUTH EVERY 6 HOURS AS NEEDED   Semaglutide, 2 MG/DOSE, 8 MG/3ML SOPN Inject 2 mg as directed once a week.   sertraline (ZOLOFT) 50 MG tablet Take 1 tablet (50 mg total) by  mouth daily.   triamcinolone cream (KENALOG) 0.5 % APPLY CREAM TOPICALLY TO AFFECTED AREA TWICE DAILY, AS DIRECTED   valACYclovir (VALTREX) 1000 MG tablet 2 tablets onset of cold sore, take 2 more tablets twelve hours later.   Vitamin D, Ergocalciferol, (DRISDOL) 1.25 MG (50000 UNIT) CAPS capsule Take 1 capsule (50,000 Units total) by mouth once a week.   zolpidem (AMBIEN) 10 MG tablet Take 1 tablet (10 mg total) by mouth at bedtime as needed. for sleep   colesevelam (WELCHOL) 625 MG tablet Take 3 tablets (1,875 mg total) by mouth 2 (two) times daily with a meal.   naproxen (NAPROSYN) 500 MG tablet TAKE 1 TABLET BY MOUTH TWICE DAILY AS NEEDED (Patient not taking: Reported on 06/25/2021)   No facility-administered medications prior to visit.    Review of Systems  Constitutional:  Negative for appetite change, chills, fatigue and fever.  Respiratory:  Negative for chest tightness and shortness of breath.   Cardiovascular:  Negative for chest pain and palpitations.  Gastrointestinal:  Negative for abdominal pain,  nausea and vomiting.  Musculoskeletal:  Positive for myalgias (right leg pain).  Neurological:  Negative for dizziness and weakness.      Objective    BP 137/81 (BP Location: Right Arm, Patient Position: Sitting, Cuff Size: Large)    Pulse 78    Temp 98.3 F (36.8 C) (Oral)    Resp (!) 22    Wt (!) 334 lb (151.5 kg)    LMP  (Within Years)    SpO2 98% Comment: room air   BMI 59.17 kg/m  {Show previous vital signs (optional):23777}  Physical Exam   General: Appearance:    Obese female in no acute distress  Eyes:    PERRL, conjunctiva/corneas clear, EOM's intact       Lungs:     Clear to auscultation bilaterally, respirations unlabored  Heart:    Normal heart rate. Normal rhythm. No murmurs, rubs, or gallops.    MS:   All extremities are intact.    Neurologic:   Awake, alert, oriented x 3. No apparent focal neurological defect.         Assessment & Plan     1. Diabetes  mellitus with coincident hypertension ([place]) Doing well on semaglutide. Consider higher dose which would likely help with additional weight loss.  - Hemoglobin A1c - Urine albumin/creatinine ratio  2. Essential hypertension Well controlled.  Continue current medications.    3. Hypothyroidism, unspecified type Feel well on current thyroid replacement.  - TSH - T4  4. Hyperlipidemia, unspecified hyperlipidemia type Intolerant to multiple statins and ezetimibe. Wellchol was not covered by insurance so she is not currently taking it.will try  colestipol (COLESTID) 1 g tablet; Take 1 tablet (1 g total) by mouth 2 (two) times daily.  Dispense: 60 tablet; Refill: 5  5. Polymyalgia rheumatica ([place]) She feels 52m prednisone is effective.   6. Elevated sed rate  - Sed Rate (ESR)  7. Need for influenza vaccination  - Flu Vaccine QUAD 36+ mos IM (Fluarix/Fluzone)         The entirety of the information documented in the History of Present Illness, Review of Systems and Physical Exam were personally obtained by me. Portions of this information were initially documented by the CMA and reviewed by me for thoroughness and accuracy.     DLelon Huh MD  [phone](phone) [phone](fax)  [place]

## 2021-06-25 NOTE — Patient Instructions (Signed)
Please call the [facility] at [facility] at [phone] to schedule your mammogram.  

## 2021-06-26 ENCOUNTER — Other Ambulatory Visit: Payer: Self-pay | Admitting: Family Medicine

## 2021-06-26 DIAGNOSIS — Z7952 Long term (current) use of systemic steroids: Secondary | ICD-10-CM

## 2021-06-26 LAB — TSH: TSH: 0.975 u[IU]/mL (ref 0.450–4.500)

## 2021-06-26 LAB — HEMOGLOBIN A1C
Est. average glucose Bld gHb Est-mCnc: 126 mg/dL
Hgb A1c MFr Bld: 6 % — ABNORMAL HIGH (ref 4.8–5.6)

## 2021-06-26 LAB — T4: T4, Total: 9.3 ug/dL (ref 4.5–12.0)

## 2021-06-26 LAB — SEDIMENTATION RATE: Sed Rate: 58 mm/h — ABNORMAL HIGH (ref 0–40)

## 2021-06-27 ENCOUNTER — Other Ambulatory Visit: Payer: Self-pay | Admitting: Family Medicine

## 2021-06-27 DIAGNOSIS — I10 Essential (primary) hypertension: Secondary | ICD-10-CM

## 2021-06-27 DIAGNOSIS — E119 Type 2 diabetes mellitus without complications: Secondary | ICD-10-CM

## 2021-06-27 MED ORDER — SEMAGLUTIDE (2 MG/DOSE) 8 MG/3ML ~~LOC~~ SOPN: 2.0000 mg | PEN_INJECTOR | SUBCUTANEOUS | 0 refills | Status: DC

## 2021-07-04 LAB — MICROALBUMIN / CREATININE URINE RATIO: Creatinine, Urine: 191.6 mg/dL

## 2021-07-10 ENCOUNTER — Other Ambulatory Visit: Payer: Self-pay | Admitting: Family Medicine

## 2021-07-10 DIAGNOSIS — M791 Myalgia, unspecified site: Secondary | ICD-10-CM

## 2021-07-10 DIAGNOSIS — R748 Abnormal levels of other serum enzymes: Secondary | ICD-10-CM

## 2021-07-10 DIAGNOSIS — R700 Elevated erythrocyte sedimentation rate: Secondary | ICD-10-CM

## 2021-07-10 MED ORDER — PREDNISONE 10 MG PO TABS: 5.0000 mg | ORAL_TABLET | Freq: Every day | ORAL | 1 refills | Status: DC

## 2021-07-12 ENCOUNTER — Encounter: Payer: Self-pay | Admitting: Family Medicine

## 2021-07-12 DIAGNOSIS — E119 Type 2 diabetes mellitus without complications: Secondary | ICD-10-CM

## 2021-07-16 ENCOUNTER — Ambulatory Visit: Payer: 59 | Admitting: Family Medicine

## 2021-07-23 ENCOUNTER — Other Ambulatory Visit: Payer: Self-pay | Admitting: Family Medicine

## 2021-07-23 DIAGNOSIS — N946 Dysmenorrhea, unspecified: Secondary | ICD-10-CM

## 2021-07-23 MED ORDER — HYDROCODONE-ACETAMINOPHEN 5-325 MG PO TABS: 1.0000 | ORAL_TABLET | Freq: Four times a day (QID) | ORAL | 0 refills | Status: DC | PRN

## 2021-07-24 NOTE — Telephone Encounter (Signed)
Do we have any 1mg  or 2mg  dose pens?

## 2021-07-25 MED ORDER — SEMAGLUTIDE (2 MG/DOSE) 8 MG/3ML ~~LOC~~ SOPN: 2.0000 mg | PEN_INJECTOR | SUBCUTANEOUS | 0 refills | Status: DC

## 2021-07-25 NOTE — Telephone Encounter (Signed)
She can have one of the 2mg  pens.

## 2021-07-25 NOTE — Telephone Encounter (Signed)
We have 4 pens of the 2mg  dose Ozempic.

## 2021-07-30 ENCOUNTER — Encounter: Payer: Self-pay | Admitting: Family Medicine

## 2021-08-14 ENCOUNTER — Other Ambulatory Visit: Payer: Self-pay

## 2021-08-14 ENCOUNTER — Ambulatory Visit
Admission: RE | Admit: 2021-08-14 | Discharge: 2021-08-14 | Disposition: A | Discharge status: Home / Self Care | Payer: 59 | Source: Ambulatory Visit | Attending: Family Medicine | Admitting: Family Medicine

## 2021-08-14 ENCOUNTER — Other Ambulatory Visit: Payer: Self-pay | Admitting: Family Medicine

## 2021-08-14 DIAGNOSIS — Z13820 Encounter for screening for osteoporosis: Secondary | ICD-10-CM | POA: Insufficient documentation

## 2021-08-14 DIAGNOSIS — N946 Dysmenorrhea, unspecified: Secondary | ICD-10-CM

## 2021-08-14 DIAGNOSIS — Z7952 Long term (current) use of systemic steroids: Secondary | ICD-10-CM | POA: Diagnosis present

## 2021-08-15 MED ORDER — HYDROCODONE-ACETAMINOPHEN 5-325 MG PO TABS: 1.0000 | ORAL_TABLET | Freq: Four times a day (QID) | ORAL | 0 refills | Status: DC | PRN

## 2021-08-26 ENCOUNTER — Other Ambulatory Visit: Payer: Self-pay | Admitting: Family Medicine

## 2021-08-26 DIAGNOSIS — E039 Hypothyroidism, unspecified: Secondary | ICD-10-CM

## 2021-08-26 NOTE — Telephone Encounter (Signed)
Requested Prescriptions  ?Pending Prescriptions Disp Refills  ?? levothyroxine (SYNTHROID) 200 MCG tablet [Pharmacy Med Name: Levothyroxine Sodium 200 MCG Oral Tablet] 90 tablet 3  ?  Sig: TAKE 1 TABLET BY MOUTH ONCE DAILY BEFORE BREAKFAST  ?  ? Endocrinology:  Hypothyroid Agents Passed - 08/26/2021  2:24 PM  ?  ?  Passed - TSH in normal range and within 360 days  ?  TSH  ?Date Value Ref Range Status  ?06/25/2021 0.975 0.450 - 4.500 uIU/mL Final  ?   ?  ?  Passed - Valid encounter within last 12 months  ?  Recent Outpatient Visits   ?      ? 2 months ago Diabetes mellitus with coincident hypertension ([place])  ? [facility] Birdie Sons, MD  ? 5 months ago Nausea and vomiting, intractability of vomiting not specified, unspecified vomiting type  ? [facility] Birdie Sons, MD  ? 9 months ago Diabetes mellitus with coincident hypertension ([place])  ? [facility] Birdie Sons, MD  ? 1 year ago Diabetes mellitus with coincident hypertension ([place])  ? [facility] Birdie Sons, MD  ? 1 year ago Diabetes mellitus with coincident hypertension ([place])  ? [facility] Caryn Section, Kirstie Peri, MD  ?  ?  ? ?  ?  ?  ? ?

## 2021-09-12 ENCOUNTER — Other Ambulatory Visit: Payer: Self-pay | Admitting: Family Medicine

## 2021-09-12 DIAGNOSIS — N946 Dysmenorrhea, unspecified: Secondary | ICD-10-CM

## 2021-09-13 MED ORDER — HYDROCODONE-ACETAMINOPHEN 5-325 MG PO TABS
1.0000 | ORAL_TABLET | Freq: Four times a day (QID) | ORAL | 0 refills | Status: DC | PRN
Start: 2021-09-13 — End: 2021-10-12

## 2021-10-03 ENCOUNTER — Ambulatory Visit: Payer: 59 | Admitting: Family Medicine

## 2021-10-08 ENCOUNTER — Other Ambulatory Visit: Payer: Self-pay | Admitting: Family Medicine

## 2021-10-08 DIAGNOSIS — E119 Type 2 diabetes mellitus without complications: Secondary | ICD-10-CM

## 2021-10-10 ENCOUNTER — Encounter: Payer: Self-pay | Admitting: Family Medicine

## 2021-10-12 ENCOUNTER — Other Ambulatory Visit: Payer: Self-pay | Admitting: Family Medicine

## 2021-10-12 DIAGNOSIS — N946 Dysmenorrhea, unspecified: Secondary | ICD-10-CM

## 2021-10-12 MED ORDER — HYDROCODONE-ACETAMINOPHEN 5-325 MG PO TABS
1.0000 | ORAL_TABLET | Freq: Four times a day (QID) | ORAL | 0 refills | Status: DC | PRN
Start: 2021-10-12 — End: 2021-11-09

## 2021-10-24 ENCOUNTER — Ambulatory Visit (INDEPENDENT_AMBULATORY_CARE_PROVIDER_SITE_OTHER): Payer: 59 | Admitting: Family Medicine

## 2021-10-24 ENCOUNTER — Encounter: Payer: Self-pay | Admitting: Family Medicine

## 2021-10-24 VITALS — Ht 63.0 in | Wt 321.3 lb

## 2021-10-24 DIAGNOSIS — E559 Vitamin D deficiency, unspecified: Secondary | ICD-10-CM

## 2021-10-24 DIAGNOSIS — E119 Type 2 diabetes mellitus without complications: Secondary | ICD-10-CM | POA: Diagnosis not present

## 2021-10-24 DIAGNOSIS — M353 Polymyalgia rheumatica: Secondary | ICD-10-CM

## 2021-10-24 DIAGNOSIS — E039 Hypothyroidism, unspecified: Secondary | ICD-10-CM

## 2021-10-24 DIAGNOSIS — R700 Elevated erythrocyte sedimentation rate: Secondary | ICD-10-CM

## 2021-10-24 DIAGNOSIS — I10 Essential (primary) hypertension: Secondary | ICD-10-CM

## 2021-10-24 LAB — POCT GLYCOSYLATED HEMOGLOBIN (HGB A1C)
Est. average glucose Bld gHb Est-mCnc: 114
Hemoglobin A1C: 5.6 % (ref 4.0–5.6)

## 2021-10-24 NOTE — Progress Notes (Signed)
?  ? ?I,Sha'taria Tyson,acting as a scribe for Lelon Huh, MD.,have documented all relevant documentation on the behalf of Lelon Huh, MD,as directed by  Lelon Huh, MD while in the presence of Lelon Huh, MD. ? ? ?Established patient visit ? ? ?Patient: Karen Estrada   DOB: 02/23/1971   51 y.o. Female  MRN: [id number] ?Visit Date: 10/24/2021 ? ?Today's healthcare provider: Lelon Huh, MD  ? ? ?Subjective  ?  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 ?-Would like to receive samples of ozempic if available ?Diabetes Mellitus Type II, Follow-up ? ?Lab Results  ?Component Value Date  ? HGBA1C 6.0 (H) 06/25/2021  ? HGBA1C 6.5 (H) 03/21/2021  ? HGBA1C 6.3 (A) 11/01/2020  ? ?Wt Readings from Last 3 Encounters:  ?06/25/21 (!) 334 lb (151.5 kg)  ?11/01/20 (!) 339 lb (153.8 kg)  ?01/12/20 (!) 341 lb (154.7 kg)  ? ?Last seen for diabetes 4 months ago.  ?Management since then includes doing well on semaglutide. Consider higher dose which would likely help with additional weight loss. ?She reports excellent compliance with treatment. ?She is not having side effects.  ?Symptoms: ?No fatigue No foot ulcerations  ?No appetite changes No nausea  ?No paresthesia of the feet  No polydipsia  ?No polyuria No visual disturbances   ?No vomiting   ? ? ? ?Home blood sugar records:  not being checked ? ?Episodes of hypoglycemia? No  ?  ?Current insulin regiment: none ?Most Recent Eye Exam: 2 years ago ?Current exercise: none ?Current diet habits: on average, 2 meals per day ? ?Pertinent Labs: ?Lab Results  ?Component Value Date  ? CHOL 282 (H) 03/21/2021  ? HDL 62 03/21/2021  ? LDLCALC 184 (H) 03/21/2021  ? TRIG 191 (H) 03/21/2021  ? CHOLHDL 4.5 (H) 03/21/2021  ? Lab Results  ?Component Value Date  ? NA 141 03/21/2021  ? K 3.8 03/21/2021   ? CREATININE 0.69 03/21/2021  ? EGFR 106 03/21/2021  ? LABMICR CANCELED 06/25/2021  ?  ? ?---------------------------------------------------------------------------------------------------  ?Medications: ?Outpatient Medications Prior to Visit  ?Medication Sig  ? Coenzyme Q10 (COQ10) 100 MG CAPS Take 1 tablet by mouth daily.  ? colestipol (COLESTID) 1 g tablet Take 1 tablet (1 g total) by mouth 2 (two) times daily.  ? glucose blood (ONETOUCH VERIO) test strip Use as instructed to check sugar three times daily for type 2 diabetes E11.9  ? HYDROcodone-acetaminophen (NORCO/VICODIN) 5-325 MG tablet Take 1 tablet by mouth every 6 (six) hours as needed for moderate pain.  ? Lancet Devices (ONE TOUCH DELICA LANCING DEV) MISC Use to check sugar three times daily for type 2 diabetes E11.9  ? levothyroxine (SYNTHROID) 200 MCG tablet TAKE 1 TABLET BY MOUTH ONCE DAILY BEFORE BREAKFAST  ? lisinopril (ZESTRIL) 10 MG tablet Take 1 tablet by mouth once daily  ? metaxalone (SKELAXIN) 800 MG tablet TAKE 1 TABLET BY MOUTH THREE TIMES DAILY AS NEEDED FOR MUSCLE SPASMS  ? metFORMIN (GLUCOPHAGE) 1000 MG tablet TAKE 1/2 (ONE-HALF) TABLET BY MOUTH IN THE MORNING AND 1 IN THE EVENING  ? Milk Thistle 140 MG CAPS Take 1 capsule by mouth daily.   ? naproxen (NAPROSYN) 500 MG tablet TAKE 1 TABLET BY MOUTH TWICE DAILY AS NEEDED (Patient not taking: Reported on 06/25/2021)  ? omeprazole (PRILOSEC) 20 MG capsule  Take 20 mg by mouth daily.   ? ondansetron (ZOFRAN) 4 MG tablet Take 1 tablet (4 mg total) by mouth every 8 (eight) hours as needed. for nausea  ? OZEMPIC, 2 MG/DOSE, 8 MG/3ML SOPN INJECT 2MG SUBCUTANEOUSLY ONCE A WEEK  ? predniSONE (DELTASONE) 10 MG tablet Take 0.5-1 tablets (5-10 mg total) by mouth daily.  ? promethazine (PHENERGAN) 25 MG tablet TAKE 1 TABLET BY MOUTH EVERY 6 HOURS AS NEEDED  ? sertraline (ZOLOFT) 50 MG tablet Take 1 tablet (50 mg total) by mouth daily.  ? triamcinolone cream (KENALOG) 0.5 % APPLY CREAM TOPICALLY TO  AFFECTED AREA TWICE DAILY, AS DIRECTED  ? valACYclovir (VALTREX) 1000 MG tablet 2 tablets onset of cold sore, take 2 more tablets twelve hours later.  ? Vitamin D, Ergocalciferol, (DRISDOL) 1.25 MG (50000 UNIT) CAPS capsule Take 1 capsule (50,000 Units total) by mouth once a week.  ? zolpidem (AMBIEN) 10 MG tablet Take 1 tablet (10 mg total) by mouth at bedtime as needed. for sleep  ? ?No facility-administered medications prior to visit.  ? ? ? ?  Objective  ?  ?Ht '5\' 3"'  (1.6 m)   Wt (!) 321 lb 4.8 oz (145.7 kg)   SpO2 96%   BMI 56.92 kg/m?  ? ? ?Physical Exam  ? ?General appearance: Severely obese female, cooperative and in no acute distress ?Head: Normocephalic, without obvious abnormality, atraumatic ?Respiratory: Respirations even and unlabored, normal respiratory rate ?Extremities: All extremities are intact.  ?Skin: Skin color, texture, turgor normal. No rashes seen  ?Psych: Appropriate mood and affect. ?Neurologic: Mental status: Alert, oriented to person, place, and time, thought content appropriate.  ? ?POC HgbA1c =5.6% ? Assessment & Plan  ?  ? ?1. Diabetes mellitus with coincident hypertension ([place]) ?Doing well with semaglutide with no adverse effects. Continue current medications.   ?- Urine Albumin-Creatinine with uACR ? ?2. Morbid obesity ([place]) ?Work on [employer] and will hopefully have more success losing weigh on semaglutide.  ? ?3. Essential hypertension ? ?- CBC ?- Comprehensive metabolic panel ?- Lipid panel ? ?Continue current medications.   ?4. Hypothyroidism, unspecified type ? ?- TSH ? ?5. Vitamin D deficiency ?Continue vitamin supplementation.  ? ?6. Elevated sed rate ? ?- Sed Rate (ESR) ? ?7. Polymyalgia rheumatica ([place]) ?Feel much better although not completely controlled on low dose prednisone.  ? ?Future Appointments  ?Date Time Provider [place]  ?01/23/2022  3:00 PM Shaleah Nissley, Kirstie Peri, MD [facility]  ?  ?   ? ?The entirety of the information documented in the History of  Present Illness, Review of Systems and Physical Exam were personally obtained by me. Portions of this information were initially documented by the CMA and reviewed by me for thoroughness and accuracy.   ? ? ?Lelon Huh, MD  ?[facility] ?[phone] (phone) ?[phone] (fax) ? ?[facility] ? ?

## 2021-10-25 LAB — COMPREHENSIVE METABOLIC PANEL
ALT: 28 IU/L (ref 0–32)
AST: 24 IU/L (ref 0–40)
Albumin/Globulin Ratio: 1.4 (ref 1.2–2.2)
Albumin: 4.1 g/dL (ref 3.8–4.9)
Alkaline Phosphatase: 94 IU/L (ref 44–121)
BUN/Creatinine Ratio: 17 (ref 9–23)
BUN: 13 mg/dL (ref 6–24)
Bilirubin Total: 0.3 mg/dL (ref 0.0–1.2)
CO2: 27 mmol/L (ref 20–29)
Calcium: 9.8 mg/dL (ref 8.7–10.2)
Chloride: 103 mmol/L (ref 96–106)
Creatinine, Ser: 0.76 mg/dL (ref 0.57–1.00)
Globulin, Total: 3 g/dL (ref 1.5–4.5)
Glucose: 85 mg/dL (ref 70–99)
Potassium: 4.1 mmol/L (ref 3.5–5.2)
Sodium: 142 mmol/L (ref 134–144)
Total Protein: 7.1 g/dL (ref 6.0–8.5)
eGFR: 95 mL/min/{1.73_m2} (ref >59)

## 2021-10-25 LAB — LIPID PANEL
Chol/HDL Ratio: 4.7 ratio — ABNORMAL HIGH (ref 0.0–4.4)
Cholesterol, Total: 244 mg/dL — ABNORMAL HIGH (ref 100–199)
HDL: 52 mg/dL (ref >39)
LDL Chol Calc (NIH): 159 mg/dL — ABNORMAL HIGH (ref 0–99)
Triglycerides: 179 mg/dL — ABNORMAL HIGH (ref 0–149)
VLDL Cholesterol Cal: 33 mg/dL (ref 5–40)

## 2021-10-25 LAB — CBC
Hematocrit: 40.3 % (ref 34.0–46.6)
Hemoglobin: 13.5 g/dL (ref 11.1–15.9)
MCH: 26.4 pg — ABNORMAL LOW (ref 26.6–33.0)
MCHC: 33.5 g/dL (ref 31.5–35.7)
MCV: 79 fL (ref 79–97)
Platelets: 263 10*3/uL (ref 150–450)
RBC: 5.12 x10E6/uL (ref 3.77–5.28)
RDW: 14.3 % (ref 11.7–15.4)
WBC: 7.9 10*3/uL (ref 3.4–10.8)

## 2021-10-25 LAB — TSH: TSH: 0.047 u[IU]/mL — ABNORMAL LOW (ref 0.450–4.500)

## 2021-10-25 LAB — SEDIMENTATION RATE: Sed Rate: 65 mm/hr — ABNORMAL HIGH (ref 0–40)

## 2021-10-26 ENCOUNTER — Encounter: Payer: Self-pay | Admitting: Family Medicine

## 2021-10-26 ENCOUNTER — Other Ambulatory Visit: Payer: Self-pay | Admitting: Family Medicine

## 2021-10-26 DIAGNOSIS — E039 Hypothyroidism, unspecified: Secondary | ICD-10-CM

## 2021-10-26 DIAGNOSIS — E119 Type 2 diabetes mellitus without complications: Secondary | ICD-10-CM

## 2021-10-26 LAB — SPECIMEN STATUS REPORT

## 2021-10-26 LAB — MICROALBUMIN / CREATININE URINE RATIO
Creatinine, Urine: 266.7 mg/dL
Microalb/Creat Ratio: 5 mg/g creat (ref 0–29)
Microalbumin, Urine: 13.8 ug/mL

## 2021-10-26 MED ORDER — LEVOTHYROXINE SODIUM 175 MCG PO TABS: 175.0000 ug | ORAL_TABLET | Freq: Every day | ORAL | 2 refills | Status: DC

## 2021-10-27 NOTE — Telephone Encounter (Signed)
We have one sample pen available. Please advise if OK to give to patient.  ?

## 2021-10-27 NOTE — Telephone Encounter (Signed)
That's fine. She can have it.  ?

## 2021-10-28 MED ORDER — OZEMPIC (2 MG/DOSE) 8 MG/3ML ~~LOC~~ SOPN: PEN_INJECTOR | SUBCUTANEOUS | 0 refills | Status: DC

## 2021-11-04 ENCOUNTER — Other Ambulatory Visit: Payer: Self-pay | Admitting: Family Medicine

## 2021-11-09 ENCOUNTER — Other Ambulatory Visit: Payer: Self-pay | Admitting: Family Medicine

## 2021-11-09 DIAGNOSIS — N946 Dysmenorrhea, unspecified: Secondary | ICD-10-CM

## 2021-11-10 MED ORDER — HYDROCODONE-ACETAMINOPHEN 5-325 MG PO TABS: 1.0000 | ORAL_TABLET | Freq: Four times a day (QID) | ORAL | 0 refills | Status: DC | PRN

## 2021-11-14 ENCOUNTER — Ambulatory Visit: Payer: 59 | Admitting: Family Medicine

## 2021-12-07 ENCOUNTER — Other Ambulatory Visit: Payer: Self-pay | Admitting: Family Medicine

## 2021-12-07 DIAGNOSIS — M62838 Other muscle spasm: Secondary | ICD-10-CM

## 2021-12-13 ENCOUNTER — Other Ambulatory Visit: Payer: Self-pay | Admitting: Family Medicine

## 2021-12-13 DIAGNOSIS — N946 Dysmenorrhea, unspecified: Secondary | ICD-10-CM

## 2021-12-14 ENCOUNTER — Encounter: Payer: Self-pay | Admitting: Family Medicine

## 2021-12-14 MED ORDER — HYDROCODONE-ACETAMINOPHEN 5-325 MG PO TABS: 1.0000 | ORAL_TABLET | Freq: Four times a day (QID) | ORAL | 0 refills | Status: DC | PRN

## 2021-12-29 ENCOUNTER — Encounter: Payer: Self-pay | Admitting: Family Medicine

## 2022-01-09 ENCOUNTER — Encounter: Payer: Self-pay | Admitting: Family Medicine

## 2022-01-14 ENCOUNTER — Encounter: Payer: Self-pay | Admitting: Family Medicine

## 2022-01-14 ENCOUNTER — Other Ambulatory Visit: Payer: Self-pay | Admitting: Family Medicine

## 2022-01-14 DIAGNOSIS — N946 Dysmenorrhea, unspecified: Secondary | ICD-10-CM

## 2022-01-14 DIAGNOSIS — R700 Elevated erythrocyte sedimentation rate: Secondary | ICD-10-CM

## 2022-01-14 DIAGNOSIS — M791 Myalgia, unspecified site: Secondary | ICD-10-CM

## 2022-01-14 DIAGNOSIS — R748 Abnormal levels of other serum enzymes: Secondary | ICD-10-CM

## 2022-01-14 DIAGNOSIS — E785 Hyperlipidemia, unspecified: Secondary | ICD-10-CM

## 2022-01-15 MED ORDER — HYDROCODONE-ACETAMINOPHEN 5-325 MG PO TABS: 1.0000 | ORAL_TABLET | Freq: Four times a day (QID) | ORAL | 0 refills | Status: DC | PRN

## 2022-01-23 ENCOUNTER — Ambulatory Visit (INDEPENDENT_AMBULATORY_CARE_PROVIDER_SITE_OTHER): Payer: 59 | Admitting: Family Medicine

## 2022-01-23 ENCOUNTER — Encounter: Payer: Self-pay | Admitting: Family Medicine

## 2022-01-23 VITALS — BP 120/79 | HR 82 | Resp 16 | Ht 63.0 in | Wt 325.7 lb

## 2022-01-23 DIAGNOSIS — E039 Hypothyroidism, unspecified: Secondary | ICD-10-CM | POA: Diagnosis not present

## 2022-01-23 DIAGNOSIS — M353 Polymyalgia rheumatica: Secondary | ICD-10-CM

## 2022-01-23 DIAGNOSIS — E559 Vitamin D deficiency, unspecified: Secondary | ICD-10-CM

## 2022-01-23 DIAGNOSIS — E119 Type 2 diabetes mellitus without complications: Secondary | ICD-10-CM

## 2022-01-23 DIAGNOSIS — I10 Essential (primary) hypertension: Secondary | ICD-10-CM | POA: Diagnosis not present

## 2022-01-23 DIAGNOSIS — R700 Elevated erythrocyte sedimentation rate: Secondary | ICD-10-CM

## 2022-01-23 DIAGNOSIS — E785 Hyperlipidemia, unspecified: Secondary | ICD-10-CM

## 2022-01-23 NOTE — Progress Notes (Unsigned)
Established patient visit   Patient: Karen Estrada   DOB: 1970/07/15   51 y.o. Female  MRN: [id number] Visit Date: 01/23/2022  Today's healthcare provider: Lelon Huh, MD   Chief Complaint  Patient presents with   Hypothyroidism        Obesity   Subjective    Follow up for hypothyroidism  The patient was last seen for this 3 months ago. Changes made at last visit include reduce to Levothyroxine 175 mcg.  She reports excellent compliance with treatment. She feels that condition is Improved. She is not having side effects.  Lab Results  Component Value Date   TSH 0.047 (L) 10/24/2021    ----------------------------------------------------------------------------------------- Follow up for morbid obesity   The patient was last seen for this 3 months ago. Changes made at last visit include continue diet and Semaglutide 2 mg once weekly. Patient reports that she is not able to afford the medication.  Wt Readings from Last 3 Encounters:  01/23/22 (!) 325 lb 11.2 oz (147.7 kg)  10/24/21 (!) 321 lb 4.8 oz (145.7 kg)  06/25/21 (!) 334 lb (151.5 kg)    -----------------------------------------------------------------------------------------    Medications: Outpatient Medications Prior to Visit  Medication Sig   Coenzyme Q10 (COQ10) 100 MG CAPS Take 1 tablet by mouth daily.   colestipol (COLESTID) 1 g tablet Take 1 tablet by mouth twice daily   glucose blood (ONETOUCH VERIO) test strip Use as instructed to check sugar three times daily for type 2 diabetes E11.9   HYDROcodone-acetaminophen (NORCO/VICODIN) 5-325 MG tablet Take 1 tablet by mouth every 6 (six) hours as needed for moderate pain.   Lancet Devices (ONE TOUCH DELICA LANCING DEV) MISC Use to check sugar three times daily for type 2 diabetes E11.9   levothyroxine (SYNTHROID) 175 MCG tablet Take 1 tablet (175 mcg total) by mouth daily.   lisinopril (ZESTRIL) 10 MG tablet Take 1 tablet by mouth once daily    metaxalone (SKELAXIN) 800 MG tablet Take 1 tablet by mouth three times daily as needed for muscle spasm   metFORMIN (GLUCOPHAGE) 1000 MG tablet TAKE 1/2 (ONE-HALF) TABLET BY MOUTH IN THE MORNING AND 1 IN THE EVENING   Milk Thistle 140 MG CAPS Take 1 capsule by mouth daily.    omeprazole (PRILOSEC) 20 MG capsule Take 20 mg by mouth daily.    ondansetron (ZOFRAN) 4 MG tablet Take 1 tablet (4 mg total) by mouth every 8 (eight) hours as needed. for nausea   predniSONE (DELTASONE) 10 MG tablet TAKE 1/2 TO 1 (ONE-HALF TO ONE) TABLET BY MOUTH ONCE DAILY   promethazine (PHENERGAN) 25 MG tablet TAKE 1 TABLET BY MOUTH EVERY 6 HOURS AS NEEDED   Semaglutide, 2 MG/DOSE, (OZEMPIC, 2 MG/DOSE,) 8 MG/3ML SOPN INJECT '2MG'$  SUBCUTANEOUSLY ONCE A WEEK   sertraline (ZOLOFT) 50 MG tablet Take 1 tablet by mouth once daily   triamcinolone cream (KENALOG) 0.5 % APPLY CREAM TOPICALLY TO AFFECTED AREA TWICE DAILY, AS DIRECTED   valACYclovir (VALTREX) 1000 MG tablet 2 tablets onset of cold sore, take 2 more tablets twelve hours later.   Vitamin D, Ergocalciferol, (DRISDOL) 1.25 MG (50000 UNIT) CAPS capsule Take 1 capsule (50,000 Units total) by mouth once a week.   zolpidem (AMBIEN) 10 MG tablet Take 1 tablet (10 mg total) by mouth at bedtime as needed. for sleep   naproxen (NAPROSYN) 500 MG tablet TAKE 1 TABLET BY MOUTH TWICE DAILY AS NEEDED   No facility-administered medications prior to visit.  Review of Systems  {Labs  Heme  Chem  Endocrine  Serology  Results Review (optional):23779}   Objective    BP 120/79 (BP Location: Left Arm, Patient Position: Sitting, Cuff Size: Large)   Pulse 82   Resp 16   Ht '5\' 3"'$  (1.6 m)   Wt (!) 325 lb 11.2 oz (147.7 kg)   SpO2 97%   BMI 57.70 kg/m  {Show previous vital signs (optional):23777}  Physical Exam  ***  No results found for any visits on 01/23/22.  Assessment & Plan     ***  No follow-ups on file.      {provider attestation***:1}   Lelon Huh, MD  [facility] [phone] (phone) [phone] (fax)  [place]

## 2022-01-24 LAB — COMPREHENSIVE METABOLIC PANEL
ALT: 41 IU/L — ABNORMAL HIGH (ref 0–32)
AST: 29 IU/L (ref 0–40)
Albumin/Globulin Ratio: 1.4 (ref 1.2–2.2)
Albumin: 4.2 g/dL (ref 3.8–4.9)
Alkaline Phosphatase: 120 IU/L (ref 44–121)
BUN/Creatinine Ratio: 17 (ref 9–23)
BUN: 12 mg/dL (ref 6–24)
Bilirubin Total: 0.4 mg/dL (ref 0.0–1.2)
CO2: 26 mmol/L (ref 20–29)
Calcium: 9.7 mg/dL (ref 8.7–10.2)
Chloride: 102 mmol/L (ref 96–106)
Creatinine, Ser: 0.69 mg/dL (ref 0.57–1.00)
Globulin, Total: 3 g/dL (ref 1.5–4.5)
Glucose: 100 mg/dL — ABNORMAL HIGH (ref 70–99)
Potassium: 4 mmol/L (ref 3.5–5.2)
Sodium: 141 mmol/L (ref 134–144)
Total Protein: 7.2 g/dL (ref 6.0–8.5)
eGFR: 105 mL/min/{1.73_m2} (ref >59)

## 2022-01-24 LAB — CBC
Hematocrit: 41 % (ref 34.0–46.6)
Hemoglobin: 13.2 g/dL (ref 11.1–15.9)
MCH: 25.9 pg — ABNORMAL LOW (ref 26.6–33.0)
MCHC: 32.2 g/dL (ref 31.5–35.7)
MCV: 80 fL (ref 79–97)
Platelets: 259 10*3/uL (ref 150–450)
RBC: 5.1 x10E6/uL (ref 3.77–5.28)
RDW: 14.2 % (ref 11.7–15.4)
WBC: 7.7 10*3/uL (ref 3.4–10.8)

## 2022-01-24 LAB — LIPID PANEL
Chol/HDL Ratio: 5.4 ratio — ABNORMAL HIGH (ref 0.0–4.4)
Cholesterol, Total: 276 mg/dL — ABNORMAL HIGH (ref 100–199)
HDL: 51 mg/dL (ref >39)
LDL Chol Calc (NIH): 181 mg/dL — ABNORMAL HIGH (ref 0–99)
Triglycerides: 233 mg/dL — ABNORMAL HIGH (ref 0–149)
VLDL Cholesterol Cal: 44 mg/dL — ABNORMAL HIGH (ref 5–40)

## 2022-01-24 LAB — SEDIMENTATION RATE: Sed Rate: 88 mm/hr — ABNORMAL HIGH (ref 0–40)

## 2022-01-24 LAB — T4, FREE: Free T4: 1.16 ng/dL (ref 0.82–1.77)

## 2022-01-24 LAB — VITAMIN D 25 HYDROXY (VIT D DEFICIENCY, FRACTURES): Vit D, 25-Hydroxy: 28.5 ng/mL — ABNORMAL LOW (ref 30.0–100.0)

## 2022-01-24 LAB — TSH: TSH: 0.438 u[IU]/mL — ABNORMAL LOW (ref 0.450–4.500)

## 2022-01-27 NOTE — Patient Instructions (Signed)
.   Please review the attached list of medications and notify my office if there are any errors.   . Please bring all of your medications to every appointment so we can make sure that our medication list is the same as yours.   

## 2022-02-01 ENCOUNTER — Encounter: Payer: Self-pay | Admitting: Family Medicine

## 2022-02-03 MED ORDER — OZEMPIC (0.25 OR 0.5 MG/DOSE) 2 MG/3ML ~~LOC~~ SOPN: 0.5000 mg | PEN_INJECTOR | SUBCUTANEOUS | 0 refills | Status: AC

## 2022-02-03 MED ORDER — REPATHA SURECLICK 140 MG/ML ~~LOC~~ SOAJ: 140.0000 mg | SUBCUTANEOUS | 0 refills | Status: DC

## 2022-02-07 LAB — FECAL OCCULT BLOOD, IMMUNOCHEMICAL: IFOBT: NEGATIVE

## 2022-02-13 ENCOUNTER — Other Ambulatory Visit: Payer: Self-pay | Admitting: Family Medicine

## 2022-02-13 DIAGNOSIS — N946 Dysmenorrhea, unspecified: Secondary | ICD-10-CM

## 2022-02-14 MED ORDER — HYDROCODONE-ACETAMINOPHEN 5-325 MG PO TABS: 1.0000 | ORAL_TABLET | Freq: Four times a day (QID) | ORAL | 0 refills | Status: DC | PRN

## 2022-02-15 NOTE — Telephone Encounter (Signed)
encounter opened in error, closed for administrative reasons.

## 2022-02-27 ENCOUNTER — Encounter: Payer: Self-pay | Admitting: Family Medicine

## 2022-03-01 ENCOUNTER — Encounter: Payer: Self-pay | Admitting: Family Medicine

## 2022-03-10 ENCOUNTER — Encounter: Payer: Self-pay | Admitting: Family Medicine

## 2022-03-11 NOTE — Telephone Encounter (Signed)
I think there are 1 or 2 Repatha pens that she can have. I doubt that there is any Ozempic.

## 2022-03-14 ENCOUNTER — Other Ambulatory Visit: Payer: Self-pay | Admitting: Family Medicine

## 2022-03-14 DIAGNOSIS — N946 Dysmenorrhea, unspecified: Secondary | ICD-10-CM

## 2022-03-16 MED ORDER — HYDROCODONE-ACETAMINOPHEN 5-325 MG PO TABS: 1.0000 | ORAL_TABLET | Freq: Four times a day (QID) | ORAL | 0 refills | Status: DC | PRN

## 2022-04-07 ENCOUNTER — Other Ambulatory Visit: Payer: Self-pay | Admitting: Family Medicine

## 2022-04-07 ENCOUNTER — Encounter: Payer: Self-pay | Admitting: Family Medicine

## 2022-04-07 DIAGNOSIS — E039 Hypothyroidism, unspecified: Secondary | ICD-10-CM

## 2022-04-08 NOTE — Telephone Encounter (Signed)
I think we have a Repatha sample that she can have. I don't think there is any Ozempic.

## 2022-04-17 ENCOUNTER — Other Ambulatory Visit: Payer: Self-pay | Admitting: Family Medicine

## 2022-04-17 ENCOUNTER — Encounter: Payer: Self-pay | Admitting: Family Medicine

## 2022-04-17 DIAGNOSIS — N946 Dysmenorrhea, unspecified: Secondary | ICD-10-CM

## 2022-04-17 MED ORDER — HYDROCODONE-ACETAMINOPHEN 5-325 MG PO TABS: 1.0000 | ORAL_TABLET | Freq: Four times a day (QID) | ORAL | 0 refills | Status: DC | PRN

## 2022-04-22 NOTE — Telephone Encounter (Signed)
Please see Mychart message. Patient can have 2 boxes of Repatha, I don't think we have any Ozempic.

## 2022-04-30 ENCOUNTER — Encounter: Payer: Self-pay | Admitting: Family Medicine

## 2022-04-30 DIAGNOSIS — R112 Nausea with vomiting, unspecified: Secondary | ICD-10-CM

## 2022-04-30 NOTE — Telephone Encounter (Signed)
Last refilled: 03/07/2021 #10 with 1 refill Last office visit: 01/23/2022 No future ov scheduled

## 2022-05-01 MED ORDER — ONDANSETRON HCL 4 MG PO TABS: 4.0000 mg | ORAL_TABLET | Freq: Three times a day (TID) | ORAL | 3 refills | Status: DC | PRN

## 2022-05-10 ENCOUNTER — Other Ambulatory Visit: Payer: Self-pay | Admitting: Family Medicine

## 2022-05-10 ENCOUNTER — Encounter: Payer: Self-pay | Admitting: Family Medicine

## 2022-05-10 DIAGNOSIS — I10 Essential (primary) hypertension: Secondary | ICD-10-CM

## 2022-05-10 DIAGNOSIS — E559 Vitamin D deficiency, unspecified: Secondary | ICD-10-CM

## 2022-05-18 ENCOUNTER — Encounter: Payer: Self-pay | Admitting: Family Medicine

## 2022-05-18 ENCOUNTER — Other Ambulatory Visit: Payer: Self-pay | Admitting: Family Medicine

## 2022-05-18 DIAGNOSIS — N946 Dysmenorrhea, unspecified: Secondary | ICD-10-CM

## 2022-05-20 MED ORDER — HYDROCODONE-ACETAMINOPHEN 5-325 MG PO TABS: 1.0000 | ORAL_TABLET | Freq: Four times a day (QID) | ORAL | 0 refills | Status: DC | PRN

## 2022-06-05 ENCOUNTER — Other Ambulatory Visit: Payer: Self-pay | Admitting: Family Medicine

## 2022-06-05 DIAGNOSIS — M62838 Other muscle spasm: Secondary | ICD-10-CM

## 2022-06-09 NOTE — Telephone Encounter (Signed)
Requested medication (s) are due for refill today: Yes  Requested medication (s) are on the active medication list: Yes  Last refill:  12/03/21  Future visit scheduled: No  Notes to clinic:  See request.    Requested Prescriptions  Pending Prescriptions Disp Refills   metaxalone (SKELAXIN) 800 MG tablet [Pharmacy Med Name: Metaxalone 800 MG Oral Tablet] 30 tablet 0    Sig: Take 1 tablet by mouth three times daily as needed for muscle spasm     Not Delegated - Analgesics:  Muscle Relaxants Failed - 06/05/2022  5:22 PM      Failed - This refill cannot be delegated      Passed - Valid encounter within last 6 months    Recent Outpatient Visits           4 months ago Type 2 diabetes mellitus without complication, without long-term current use of insulin ([place])   [facility] Birdie Sons, MD   7 months ago Diabetes mellitus with coincident hypertension [facility])   [facility] Birdie Sons, MD   11 months ago Diabetes mellitus with coincident hypertension [facility])   [facility] Birdie Sons, MD   1 year ago Nausea and vomiting, intractability of vomiting not specified, unspecified vomiting type   [facility] Birdie Sons, MD   1 year ago Diabetes mellitus with coincident hypertension [facility])   [facility] Caryn Section, Kirstie Peri, MD

## 2022-06-18 ENCOUNTER — Other Ambulatory Visit: Payer: Self-pay | Admitting: Family Medicine

## 2022-06-18 DIAGNOSIS — N946 Dysmenorrhea, unspecified: Secondary | ICD-10-CM

## 2022-06-18 MED ORDER — HYDROCODONE-ACETAMINOPHEN 5-325 MG PO TABS: 1.0000 | ORAL_TABLET | Freq: Four times a day (QID) | ORAL | 0 refills | Status: DC | PRN

## 2022-06-22 ENCOUNTER — Encounter: Payer: Self-pay | Admitting: Family Medicine

## 2022-07-08 ENCOUNTER — Encounter: Payer: Self-pay | Admitting: Family Medicine

## 2022-07-08 ENCOUNTER — Other Ambulatory Visit: Payer: Self-pay | Admitting: Family Medicine

## 2022-07-08 DIAGNOSIS — R748 Abnormal levels of other serum enzymes: Secondary | ICD-10-CM

## 2022-07-08 DIAGNOSIS — R700 Elevated erythrocyte sedimentation rate: Secondary | ICD-10-CM

## 2022-07-08 DIAGNOSIS — M62838 Other muscle spasm: Secondary | ICD-10-CM

## 2022-07-08 DIAGNOSIS — M791 Myalgia, unspecified site: Secondary | ICD-10-CM

## 2022-07-14 ENCOUNTER — Other Ambulatory Visit: Payer: Self-pay | Admitting: Family Medicine

## 2022-07-14 DIAGNOSIS — N946 Dysmenorrhea, unspecified: Secondary | ICD-10-CM

## 2022-07-15 NOTE — Telephone Encounter (Signed)
Yes, she can have those samples. Thanks!

## 2022-07-16 MED ORDER — HYDROCODONE-ACETAMINOPHEN 5-325 MG PO TABS: 1.0000 | ORAL_TABLET | Freq: Four times a day (QID) | ORAL | 0 refills | Status: DC | PRN

## 2022-08-02 ENCOUNTER — Other Ambulatory Visit: Payer: Self-pay | Admitting: Family Medicine

## 2022-08-02 DIAGNOSIS — M62838 Other muscle spasm: Secondary | ICD-10-CM

## 2022-08-14 ENCOUNTER — Other Ambulatory Visit: Payer: Self-pay | Admitting: Family Medicine

## 2022-08-14 DIAGNOSIS — M791 Myalgia, unspecified site: Secondary | ICD-10-CM

## 2022-08-14 DIAGNOSIS — N946 Dysmenorrhea, unspecified: Secondary | ICD-10-CM

## 2022-08-14 DIAGNOSIS — R748 Abnormal levels of other serum enzymes: Secondary | ICD-10-CM

## 2022-08-14 DIAGNOSIS — R700 Elevated erythrocyte sedimentation rate: Secondary | ICD-10-CM

## 2022-08-14 MED ORDER — HYDROCODONE-ACETAMINOPHEN 5-325 MG PO TABS: 1.0000 | ORAL_TABLET | Freq: Four times a day (QID) | ORAL | 0 refills | Status: DC | PRN

## 2022-08-15 MED ORDER — PREDNISONE 10 MG PO TABS: 10.0000 mg | ORAL_TABLET | Freq: Every day | ORAL | 0 refills | Status: DC

## 2022-08-30 ENCOUNTER — Encounter: Payer: Self-pay | Admitting: Family Medicine

## 2022-08-31 NOTE — Telephone Encounter (Signed)
She can have 2 sample pens if we have them

## 2022-09-15 ENCOUNTER — Other Ambulatory Visit: Payer: Self-pay | Admitting: Family Medicine

## 2022-09-15 DIAGNOSIS — N946 Dysmenorrhea, unspecified: Secondary | ICD-10-CM

## 2022-09-15 MED ORDER — HYDROCODONE-ACETAMINOPHEN 5-325 MG PO TABS: 1.0000 | ORAL_TABLET | Freq: Four times a day (QID) | ORAL | 0 refills | Status: DC | PRN

## 2022-09-27 ENCOUNTER — Other Ambulatory Visit: Payer: Self-pay | Admitting: Family Medicine

## 2022-09-27 DIAGNOSIS — R748 Abnormal levels of other serum enzymes: Secondary | ICD-10-CM

## 2022-09-27 DIAGNOSIS — M791 Myalgia, unspecified site: Secondary | ICD-10-CM

## 2022-09-27 DIAGNOSIS — R700 Elevated erythrocyte sedimentation rate: Secondary | ICD-10-CM

## 2022-10-06 ENCOUNTER — Encounter: Payer: Self-pay | Admitting: Family Medicine

## 2022-10-06 NOTE — Telephone Encounter (Signed)
Is overdue for o.v. she needs to schedule within the next month. Samples of Ozempic of Repatha in Fridge to get her by until o.v.

## 2022-10-14 ENCOUNTER — Other Ambulatory Visit: Payer: Self-pay | Admitting: Family Medicine

## 2022-10-14 DIAGNOSIS — M62838 Other muscle spasm: Secondary | ICD-10-CM

## 2022-10-14 DIAGNOSIS — R112 Nausea with vomiting, unspecified: Secondary | ICD-10-CM

## 2022-10-14 DIAGNOSIS — N946 Dysmenorrhea, unspecified: Secondary | ICD-10-CM

## 2022-10-15 MED ORDER — HYDROCODONE-ACETAMINOPHEN 5-325 MG PO TABS: 1.0000 | ORAL_TABLET | Freq: Four times a day (QID) | ORAL | 0 refills | Status: DC | PRN

## 2022-10-29 NOTE — Progress Notes (Signed)
I,Joseline E Rosas,acting as a scribe for Mila Merry, MD.,have documented all relevant documentation on the behalf of Mila Merry, MD,as directed by  Mila Merry, MD while in the presence of Mila Merry, MD.   Established patient visit   Patient: Karen Estrada   DOB: Nov 12, 1970   52 y.o. Female  MRN: [id number] Visit Date: 10/30/2022  Today's healthcare provider: Mila Merry, MD   Chief Complaint  Patient presents with   Follow-up   Subjective    HPI  Diabetes Mellitus Type II, follow-up  Lab Results  Component Value Date   HGBA1C 5.6 10/24/2021   HGBA1C 6.0 (H) 06/25/2021   HGBA1C 6.5 (H) 03/21/2021   Last seen for diabetes 8 months ago.  Management since then includes.Ozempic She reports excellent compliance with treatment. She is not having side effects.   Home blood sugar records:  Checking sometimes and when she does is int the 120  Episodes of hypoglycemia? No    Current insulin regiment: none Most Recent Eye Exam: 2 years ago  --------------------------------------------------------------------------------------------------- Hypertension, follow-up  BP Readings from Last 3 Encounters:  10/30/22 121/80  01/23/22 120/79  06/25/21 137/81   Wt Readings from Last 3 Encounters:  10/30/22 (!) 324 lb 9.6 oz (147.2 kg)  01/23/22 (!) 325 lb 11.2 oz (147.7 kg)  10/24/21 (!) 321 lb 4.8 oz (145.7 kg)     She was last seen for hypertension 8 months ago.  BP at that visit was 120/79. Management since that visit includes continue current medications. She reports excellent compliance with treatment.  Outside blood pressures are not being checked.  --------------------------------------------------------------------------------------------------- Lipid/Cholesterol, follow-up  Last Lipid Panel: Lab Results  Component Value Date   CHOL 276 (H) 01/23/2022   LDLCALC 181 (H) 01/23/2022   HDL 51 01/23/2022   TRIG 233 (H) 01/23/2022    She was  last seen for this 8 months ago.  Management since that visit includes start Repatha.She only does the Repatha when we have samples due to cost.  She reports fair compliance with treatment. She is not having side effects.   Symptoms: No appetite changes No foot ulcerations  No chest pain No chest pressure/discomfort  No dyspnea No orthopnea  No fatigue No lower extremity edema  No palpitations No paroxysmal nocturnal dyspnea  No nausea Yes numbness or tingling of extremity  No polydipsia No polyuria  No speech difficulty No syncope   Last metabolic panel Lab Results  Component Value Date   GLUCOSE 100 (H) 01/23/2022   NA 141 01/23/2022   K 4.0 01/23/2022   BUN 12 01/23/2022   CREATININE 0.69 01/23/2022   EGFR 105 01/23/2022   GFRNONAA 99 05/24/2020   CALCIUM 9.7 01/23/2022   AST 29 01/23/2022   ALT 41 (H) 01/23/2022   The 10-year ASCVD risk score (Arnett DK, et al., 2019) is: 5.5%  ---------------------------------------------------------------------------------------------------  She also reports that Zoloft is not working as well for depression anymore and she would like to increase dose. She took 2 of the 50mg  tablets for a few days which she found to be helpful.    Medications: Outpatient Medications Prior to Visit  Medication Sig   Coenzyme Q10 (COQ10) 100 MG CAPS Take 1 tablet by mouth daily.   Evolocumab (REPATHA SURECLICK) 140 MG/ML SOAJ Inject 140 mg into the skin every 14 (fourteen) days.   glucose blood (ONETOUCH VERIO) test strip Use as instructed to check sugar three times daily for type 2 diabetes E11.9  HYDROcodone-acetaminophen (NORCO/VICODIN) 5-325 MG tablet Take 1 tablet by mouth every 6 (six) hours as needed for moderate pain.   Lancet Devices (ONE TOUCH DELICA LANCING DEV) MISC Use to check sugar three times daily for type 2 diabetes E11.9   levothyroxine (SYNTHROID) 175 MCG tablet Take 1 tablet by mouth once daily   lisinopril (ZESTRIL) 10 MG tablet  Take 1 tablet by mouth once daily   metaxalone (SKELAXIN) 800 MG tablet Take 1 tablet by mouth three times daily as needed for muscle spasm   Milk Thistle 140 MG CAPS Take 1 capsule by mouth daily.    naproxen (NAPROSYN) 500 MG tablet TAKE 1 TABLET BY MOUTH TWICE DAILY AS NEEDED   omeprazole (PRILOSEC) 20 MG capsule Take 20 mg by mouth daily.    ondansetron (ZOFRAN) 4 MG tablet TAKE 1 TABLET BY MOUTH EVERY 8 HOURS AS NEEDED FOR NAUSEA   predniSONE (DELTASONE) 10 MG tablet Take 1 tablet (10 mg total) by mouth daily.   promethazine (PHENERGAN) 25 MG tablet TAKE 1 TABLET BY MOUTH EVERY 6 HOURS AS NEEDED   Semaglutide INJECT SUBCUTANEOUSLY ONCE A WEEK   sertraline (ZOLOFT) 50 MG tablet Take 1 tablet by mouth once daily   triamcinolone cream (KENALOG) 0.5 % APPLY CREAM TOPICALLY TO AFFECTED AREA TWICE DAILY, AS DIRECTED   valACYclovir (VALTREX) 1000 MG tablet 2 tablets onset of cold sore, take 2 more tablets twelve hours later.   Vitamin D, Ergocalciferol, (DRISDOL) 1.25 MG (50000 UNIT) CAPS capsule Take 1 capsule by mouth once a week   zolpidem (AMBIEN) 10 MG tablet Take 1 tablet (10 mg total) by mouth at bedtime as needed. for sleep   colestipol (COLESTID) 1 g tablet Take 1 tablet by mouth twice daily   metFORMIN (GLUCOPHAGE) 1000 MG tablet TAKE 1/2 (ONE-HALF) TABLET BY MOUTH IN THE MORNING AND 1 IN THE EVENING   No facility-administered medications prior to visit.        Objective    BP 121/80 (BP Location: Right Arm, Patient Position: Sitting, Cuff Size: Large)   Pulse 84   Resp 16   Ht 5\' 3"  (1.6 m)   Wt (!) 324 lb 9.6 oz (147.2 kg)   SpO2 97%   BMI 57.50 kg/m    Physical Exam  General appearance: Obese female, cooperative and in no acute distress Head: Normocephalic, without obvious abnormality, atraumatic Respiratory: Respirations even and unlabored, normal respiratory rate Extremities: All extremities are intact.  Skin: Skin color, texture, turgor normal. No rashes seen   Psych: Appropriate mood and affect. Neurologic: Mental status: Alert, oriented to person, place, and time, thought content appropriate.   Assessment & Plan     1. Diabetes mellitus with coincident hypertension (HCC) Doing well current medications. Does well with 2mg  dose, but has been cost prohibitive, recently just using samples.  - Urine microalbumin-creatinine with uACR - Hemoglobin A1c - Semaglutide, 2 MG/DOSE, 8 MG/3ML SOPN; Inject 2 mg as directed once a week.  Dispense: 3 mL; Refill: 3  2. Morbid obesity (HCC)  - Cortisol  3. Depression, unspecified depression type Has been worse recently, increase sertraline (ZOLOFT) 100 MG tablet to Take 1 tablet (100 mg total) by mouth daily.  Dispense: 30 tablet; Refill: 5  4. Hypothyroidism, unspecified type  - TSH + free T4  5. Hyperlipidemia, unspecified hyperlipidemia type Intolerant to multiple statins. Doing well on Repatha.  - Lipid panel - Comprehensive metabolic panel  6. Vitamin D deficiency  - Vitamin D (25 hydroxy)  7. Essential hypertension Well controlled.  .ccm  - Magnesium  8. Polymyalgia rheumatica (HCC) refill cyclobenzaprine (FLEXERIL) 5 MG tablet; Take 1 tablet (5 mg total) by mouth 3 (three) times daily as needed for muscle spasms.  Dispense: 30 tablet; Refill: 1  Fairly well controlled on 10mg  daily prednisone.      The entirety of the information documented in the History of Present Illness, Review of Systems and Physical Exam were personally obtained by me. Portions of this information were initially documented by the CMA and reviewed by me for thoroughness and accuracy.     Mila Merry, MD  [facility] [phone] (phone) [phone] (fax)  [facility]

## 2022-10-30 ENCOUNTER — Other Ambulatory Visit: Payer: Self-pay | Admitting: Family Medicine

## 2022-10-30 ENCOUNTER — Ambulatory Visit (INDEPENDENT_AMBULATORY_CARE_PROVIDER_SITE_OTHER): Payer: 59 | Admitting: Family Medicine

## 2022-10-30 ENCOUNTER — Encounter: Payer: Self-pay | Admitting: Family Medicine

## 2022-10-30 VITALS — BP 121/80 | HR 84 | Resp 16 | Ht 63.0 in | Wt 324.6 lb

## 2022-10-30 DIAGNOSIS — E119 Type 2 diabetes mellitus without complications: Secondary | ICD-10-CM | POA: Diagnosis not present

## 2022-10-30 DIAGNOSIS — E559 Vitamin D deficiency, unspecified: Secondary | ICD-10-CM

## 2022-10-30 DIAGNOSIS — R748 Abnormal levels of other serum enzymes: Secondary | ICD-10-CM

## 2022-10-30 DIAGNOSIS — I10 Essential (primary) hypertension: Secondary | ICD-10-CM

## 2022-10-30 DIAGNOSIS — G720 Drug-induced myopathy: Secondary | ICD-10-CM

## 2022-10-30 DIAGNOSIS — F32A Depression, unspecified: Secondary | ICD-10-CM | POA: Diagnosis not present

## 2022-10-30 DIAGNOSIS — M791 Myalgia, unspecified site: Secondary | ICD-10-CM

## 2022-10-30 DIAGNOSIS — E039 Hypothyroidism, unspecified: Secondary | ICD-10-CM | POA: Diagnosis not present

## 2022-10-30 DIAGNOSIS — T466X5A Adverse effect of antihyperlipidemic and antiarteriosclerotic drugs, initial encounter: Secondary | ICD-10-CM

## 2022-10-30 DIAGNOSIS — E785 Hyperlipidemia, unspecified: Secondary | ICD-10-CM

## 2022-10-30 DIAGNOSIS — R700 Elevated erythrocyte sedimentation rate: Secondary | ICD-10-CM

## 2022-10-30 DIAGNOSIS — M353 Polymyalgia rheumatica: Secondary | ICD-10-CM

## 2022-10-30 MED ORDER — CYCLOBENZAPRINE HCL 5 MG PO TABS
5.0000 mg | ORAL_TABLET | Freq: Three times a day (TID) | ORAL | 1 refills | Status: DC | PRN
Start: 2022-10-30 — End: 2022-12-26

## 2022-10-30 MED ORDER — SEMAGLUTIDE (2 MG/DOSE) 8 MG/3ML ~~LOC~~ SOPN
2.0000 mg | PEN_INJECTOR | SUBCUTANEOUS | 3 refills | Status: DC
Start: 2022-10-30 — End: 2023-06-21

## 2022-10-30 MED ORDER — SERTRALINE HCL 100 MG PO TABS: 100.0000 mg | ORAL_TABLET | Freq: Every day | ORAL | 5 refills | Status: DC

## 2022-10-30 NOTE — Patient Instructions (Signed)
.   Please review the attached list of medications and notify my office if there are any errors.   . Please contact your eyecare professional to schedule a routine eye exam  

## 2022-10-30 NOTE — Telephone Encounter (Signed)
Requested medication (s) are due for refill today: review  Requested medication (s) are on the active medication list: yes  Last refill:  08/15/22 #30/0  Future visit scheduled: had OV today  Notes to clinic:  Unable to refill per protocol, cannot delegate.

## 2022-10-31 ENCOUNTER — Other Ambulatory Visit: Payer: Self-pay | Admitting: Family Medicine

## 2022-10-31 DIAGNOSIS — R700 Elevated erythrocyte sedimentation rate: Secondary | ICD-10-CM

## 2022-10-31 DIAGNOSIS — R748 Abnormal levels of other serum enzymes: Secondary | ICD-10-CM

## 2022-10-31 DIAGNOSIS — M791 Myalgia, unspecified site: Secondary | ICD-10-CM

## 2022-10-31 LAB — MICROALBUMIN / CREATININE URINE RATIO
Creatinine, Urine: 303.4 mg/dL
Microalb/Creat Ratio: 5 mg/g creat (ref 0–29)
Microalbumin, Urine: 14.8 ug/mL

## 2022-10-31 LAB — COMPREHENSIVE METABOLIC PANEL
ALT: 27 IU/L (ref 0–32)
AST: 25 IU/L (ref 0–40)
Albumin/Globulin Ratio: 1.4 (ref 1.2–2.2)
Albumin: 4 g/dL (ref 3.8–4.9)
Alkaline Phosphatase: 83 IU/L (ref 44–121)
BUN/Creatinine Ratio: 16 (ref 9–23)
BUN: 12 mg/dL (ref 6–24)
Bilirubin Total: 0.4 mg/dL (ref 0.0–1.2)
CO2: 24 mmol/L (ref 20–29)
Calcium: 9.3 mg/dL (ref 8.7–10.2)
Chloride: 103 mmol/L (ref 96–106)
Creatinine, Ser: 0.76 mg/dL (ref 0.57–1.00)
Globulin, Total: 2.9 g/dL (ref 1.5–4.5)
Glucose: 83 mg/dL (ref 70–99)
Potassium: 3.9 mmol/L (ref 3.5–5.2)
Sodium: 142 mmol/L (ref 134–144)
Total Protein: 6.9 g/dL (ref 6.0–8.5)
eGFR: 94 mL/min/{1.73_m2} (ref >59)

## 2022-10-31 LAB — HEMOGLOBIN A1C
Est. average glucose Bld gHb Est-mCnc: 126 mg/dL
Hgb A1c MFr Bld: 6 % — ABNORMAL HIGH (ref 4.8–5.6)

## 2022-10-31 LAB — LIPID PANEL
Chol/HDL Ratio: 2.8 ratio (ref 0.0–4.4)
Cholesterol, Total: 163 mg/dL (ref 100–199)
HDL: 58 mg/dL (ref >39)
LDL Chol Calc (NIH): 82 mg/dL (ref 0–99)
Triglycerides: 129 mg/dL (ref 0–149)
VLDL Cholesterol Cal: 23 mg/dL (ref 5–40)

## 2022-10-31 LAB — VITAMIN D 25 HYDROXY (VIT D DEFICIENCY, FRACTURES): Vit D, 25-Hydroxy: 49.3 ng/mL (ref 30.0–100.0)

## 2022-10-31 LAB — TSH+FREE T4
Free T4: 1.32 ng/dL (ref 0.82–1.77)
TSH: 1.3 u[IU]/mL (ref 0.450–4.500)

## 2022-10-31 LAB — CORTISOL: Cortisol: 4.7 ug/dL — ABNORMAL LOW (ref 6.2–19.4)

## 2022-10-31 LAB — MAGNESIUM: Magnesium: 1.9 mg/dL (ref 1.6–2.3)

## 2022-11-01 MED ORDER — PREDNISONE 10 MG PO TABS: 10.0000 mg | ORAL_TABLET | Freq: Every day | ORAL | 3 refills | Status: DC

## 2022-11-02 ENCOUNTER — Telehealth: Payer: Self-pay | Admitting: Family Medicine

## 2022-11-02 NOTE — Telephone Encounter (Signed)
Health screening form being sent back for her work.  Please complete and fax to # on the form.

## 2022-11-04 ENCOUNTER — Encounter: Payer: Self-pay | Admitting: Family Medicine

## 2022-11-04 NOTE — Telephone Encounter (Signed)
Form faxed to number listed on form [phone].

## 2022-11-04 NOTE — Telephone Encounter (Signed)
Form completed and left in nurse box

## 2022-11-06 ENCOUNTER — Encounter: Payer: Self-pay | Admitting: Family Medicine

## 2022-11-17 ENCOUNTER — Encounter: Payer: Self-pay | Admitting: Family Medicine

## 2022-11-18 ENCOUNTER — Encounter: Payer: Self-pay | Admitting: Family Medicine

## 2022-11-19 ENCOUNTER — Other Ambulatory Visit: Payer: Self-pay | Admitting: Family Medicine

## 2022-11-19 DIAGNOSIS — N946 Dysmenorrhea, unspecified: Secondary | ICD-10-CM

## 2022-11-20 MED ORDER — HYDROCODONE-ACETAMINOPHEN 5-325 MG PO TABS
1.0000 | ORAL_TABLET | Freq: Four times a day (QID) | ORAL | 0 refills | Status: DC | PRN
Start: 2022-11-20 — End: 2022-12-17

## 2022-12-04 ENCOUNTER — Other Ambulatory Visit: Payer: Self-pay | Admitting: Family Medicine

## 2022-12-04 ENCOUNTER — Encounter: Payer: Self-pay | Admitting: Family Medicine

## 2022-12-04 DIAGNOSIS — Z1211 Encounter for screening for malignant neoplasm of colon: Secondary | ICD-10-CM

## 2022-12-04 DIAGNOSIS — R195 Other fecal abnormalities: Secondary | ICD-10-CM

## 2022-12-07 ENCOUNTER — Telehealth: Payer: Self-pay

## 2022-12-07 ENCOUNTER — Other Ambulatory Visit: Payer: Self-pay

## 2022-12-07 DIAGNOSIS — Z1211 Encounter for screening for malignant neoplasm of colon: Secondary | ICD-10-CM

## 2022-12-07 DIAGNOSIS — R195 Other fecal abnormalities: Secondary | ICD-10-CM

## 2022-12-07 MED ORDER — NA SULFATE-K SULFATE-MG SULF 17.5-3.13-1.6 GM/177ML PO SOLN: 1.0000 | Freq: Once | ORAL | 0 refills | Status: AC

## 2022-12-07 NOTE — Telephone Encounter (Signed)
Gastroenterology Pre-Procedure Review  Request Date: 01/25/23 Requesting Physician: Dr. Tobi Bastos  PATIENT REVIEW QUESTIONS: The patient responded to the following health history questions as indicated:    1. Are you having any GI issues? yes (Positive FIT, 1st colonoscopy) 2. Do you have a personal history of Polyps? no 3. Do you have a family history of Colon Cancer or Polyps? no 4. Diabetes Mellitus? yes (based on chart review pt is diabetic. She said she is not.) 5. Joint replacements in the past 12 months?no 6. Major health problems in the past 3 months?no 7. Any artificial heart valves, MVP, or defibrillator?no    MEDICATIONS & ALLERGIES:    Patient reports the following regarding taking any anticoagulation/antiplatelet therapy:   Plavix, Coumadin, Eliquis, Xarelto, Lovenox, Pradaxa, Brilinta, or Effient? no Aspirin? no  Patient confirms/reports the following medications:  Current Outpatient Medications  Medication Sig Dispense Refill   Coenzyme Q10 (COQ10) 100 MG CAPS Take 1 tablet by mouth daily.     colestipol (COLESTID) 1 g tablet Take 1 tablet by mouth twice daily 60 tablet 4   cyclobenzaprine (FLEXERIL) 5 MG tablet Take 1 tablet (5 mg total) by mouth 3 (three) times daily as needed for muscle spasms. 30 tablet 1   Evolocumab (REPATHA SURECLICK) 140 MG/ML SOAJ Inject 140 mg into the skin every 14 (fourteen) days. 2 mL 0   glucose blood (ONETOUCH VERIO) test strip Use as instructed to check sugar three times daily for type 2 diabetes E11.9 100 each 4   HYDROcodone-acetaminophen (NORCO/VICODIN) 5-325 MG tablet Take 1 tablet by mouth every 6 (six) hours as needed for moderate pain. 30 tablet 0   Lancet Devices (ONE TOUCH DELICA LANCING DEV) MISC Use to check sugar three times daily for type 2 diabetes E11.9 100 each 4   levothyroxine (SYNTHROID) 175 MCG tablet Take 1 tablet by mouth once daily 90 tablet 4   lisinopril (ZESTRIL) 10 MG tablet Take 1 tablet by mouth once daily 90  tablet 4   metaxalone (SKELAXIN) 800 MG tablet Take 1 tablet by mouth three times daily as needed for muscle spasm 30 tablet 1   metFORMIN (GLUCOPHAGE) 1000 MG tablet TAKE 1/2 (ONE-HALF) TABLET BY MOUTH IN THE MORNING AND 1 IN THE EVENING 135 tablet 4   Milk Thistle 140 MG CAPS Take 1 capsule by mouth daily.      naproxen (NAPROSYN) 500 MG tablet TAKE 1 TABLET BY MOUTH TWICE DAILY AS NEEDED 90 tablet 4   omeprazole (PRILOSEC) 20 MG capsule Take 20 mg by mouth daily.      ondansetron (ZOFRAN) 4 MG tablet TAKE 1 TABLET BY MOUTH EVERY 8 HOURS AS NEEDED FOR NAUSEA 10 tablet 2   predniSONE (DELTASONE) 10 MG tablet Take 1 tablet (10 mg total) by mouth daily. 30 tablet 3   promethazine (PHENERGAN) 25 MG tablet TAKE 1 TABLET BY MOUTH EVERY 6 HOURS AS NEEDED 30 tablet 2   Semaglutide, 2 MG/DOSE, (OZEMPIC, 2 MG/DOSE,) 8 MG/3ML SOPN INJECT 2MG  SUBCUTANEOUSLY ONCE A WEEK 3 mL 0   Semaglutide, 2 MG/DOSE, 8 MG/3ML SOPN Inject 2 mg as directed once a week. 3 mL 3   sertraline (ZOLOFT) 100 MG tablet Take 1 tablet (100 mg total) by mouth daily. 30 tablet 5   triamcinolone cream (KENALOG) 0.5 % APPLY CREAM TOPICALLY TO AFFECTED AREA TWICE DAILY, AS DIRECTED 30 g 5   valACYclovir (VALTREX) 1000 MG tablet 2 tablets onset of cold sore, take 2 more tablets twelve hours later. 24  tablet 1   Vitamin D, Ergocalciferol, (DRISDOL) 1.25 MG (50000 UNIT) CAPS capsule Take 1 capsule by mouth once a week 4 capsule 12   zolpidem (AMBIEN) 10 MG tablet Take 1 tablet (10 mg total) by mouth at bedtime as needed. for sleep 30 tablet 5   No current facility-administered medications for this visit.    Patient confirms/reports the following allergies:  Allergies  Allergen Reactions   Atorvastatin     Muscle pains   Fluticasone     Caused nosebleeds   Lovastatin     Elevated CK (1012)   Pioglitazone Diarrhea   Rosuvastatin     Myalgia   Zetia [Ezetimibe]     Leg cramps    No orders of the defined types were placed in this  encounter.   AUTHORIZATION INFORMATION Primary Insurance: 1D#: Group #:  Secondary Insurance: 1D#: Group #:  SCHEDULE INFORMATION: Date: 01/25/23 Time: Location: [facility]

## 2022-12-15 ENCOUNTER — Encounter: Payer: Self-pay | Admitting: Family Medicine

## 2022-12-17 ENCOUNTER — Other Ambulatory Visit: Payer: Self-pay | Admitting: Family Medicine

## 2022-12-17 DIAGNOSIS — N946 Dysmenorrhea, unspecified: Secondary | ICD-10-CM

## 2022-12-17 DIAGNOSIS — M62838 Other muscle spasm: Secondary | ICD-10-CM

## 2022-12-17 MED ORDER — HYDROCODONE-ACETAMINOPHEN 5-325 MG PO TABS
1.0000 | ORAL_TABLET | Freq: Four times a day (QID) | ORAL | 0 refills | Status: DC | PRN
Start: 2022-12-17 — End: 2023-01-16

## 2022-12-25 ENCOUNTER — Other Ambulatory Visit: Payer: Self-pay | Admitting: Family Medicine

## 2022-12-25 ENCOUNTER — Encounter: Payer: Self-pay | Admitting: Family Medicine

## 2022-12-25 DIAGNOSIS — M353 Polymyalgia rheumatica: Secondary | ICD-10-CM

## 2023-01-16 ENCOUNTER — Encounter: Payer: Self-pay | Admitting: Family Medicine

## 2023-01-16 ENCOUNTER — Other Ambulatory Visit: Payer: Self-pay | Admitting: Family Medicine

## 2023-01-16 DIAGNOSIS — M62838 Other muscle spasm: Secondary | ICD-10-CM

## 2023-01-16 DIAGNOSIS — N946 Dysmenorrhea, unspecified: Secondary | ICD-10-CM

## 2023-01-17 MED ORDER — HYDROCODONE-ACETAMINOPHEN 5-325 MG PO TABS
1.0000 | ORAL_TABLET | Freq: Four times a day (QID) | ORAL | 0 refills | Status: DC | PRN
Start: 2023-01-17 — End: 2023-02-16

## 2023-01-18 NOTE — Telephone Encounter (Signed)
Requested medications are due for refill today.  yes  Requested medications are on the active medications list.  yes  Last refill. 12/18/2022 #30 0 rf  Future visit scheduled.   no  Notes to clinic.  Refill not delegated.    Requested Prescriptions  Pending Prescriptions Disp Refills   metaxalone (SKELAXIN) 800 MG tablet [Pharmacy Med Name: Metaxalone 800 MG Oral Tablet] 30 tablet 0    Sig: Take 1 tablet by mouth three times daily as needed for muscle spasm     Not Delegated - Analgesics:  Muscle Relaxants Failed - 01/16/2023 11:13 PM      Failed - This refill cannot be delegated      Passed - Valid encounter within last 6 months    Recent Outpatient Visits           2 months ago Diabetes mellitus with coincident hypertension (HCC)   [place] [facility] Malva Limes, MD   12 months ago Type 2 diabetes mellitus without complication, without long-term current use of insulin (HCC)   [facility] Malva Limes, MD   1 year ago Diabetes mellitus with coincident hypertension [facility])   [facility] Malva Limes, MD   1 year ago Diabetes mellitus with coincident hypertension [facility])   [place] [facility] Malva Limes, MD   1 year ago Nausea and vomiting, intractability of vomiting not specified, unspecified vomiting type   [facility] Malva Limes, MD

## 2023-01-25 ENCOUNTER — Encounter: Payer: Self-pay | Admitting: Gastroenterology

## 2023-01-25 ENCOUNTER — Ambulatory Visit: Payer: 59 | Admitting: Anesthesiology

## 2023-01-25 ENCOUNTER — Ambulatory Visit
Admission: RE | Admit: 2023-01-25 | Discharge: 2023-01-25 | Disposition: A | Discharge status: Home / Self Care | Payer: 59 | Attending: Gastroenterology | Admitting: Gastroenterology

## 2023-01-25 ENCOUNTER — Encounter
Admission: RE | Disposition: A | Discharge status: Home / Self Care | Payer: Self-pay | Source: Home / Self Care | Attending: Gastroenterology

## 2023-01-25 ENCOUNTER — Other Ambulatory Visit: Payer: Self-pay

## 2023-01-25 DIAGNOSIS — Z87891 Personal history of nicotine dependence: Secondary | ICD-10-CM | POA: Diagnosis not present

## 2023-01-25 DIAGNOSIS — E119 Type 2 diabetes mellitus without complications: Secondary | ICD-10-CM | POA: Diagnosis not present

## 2023-01-25 DIAGNOSIS — K621 Rectal polyp: Secondary | ICD-10-CM | POA: Diagnosis not present

## 2023-01-25 DIAGNOSIS — Z7984 Long term (current) use of oral hypoglycemic drugs: Secondary | ICD-10-CM | POA: Insufficient documentation

## 2023-01-25 DIAGNOSIS — Z7952 Long term (current) use of systemic steroids: Secondary | ICD-10-CM | POA: Diagnosis not present

## 2023-01-25 DIAGNOSIS — Z1211 Encounter for screening for malignant neoplasm of colon: Secondary | ICD-10-CM

## 2023-01-25 DIAGNOSIS — K219 Gastro-esophageal reflux disease without esophagitis: Secondary | ICD-10-CM | POA: Insufficient documentation

## 2023-01-25 DIAGNOSIS — E785 Hyperlipidemia, unspecified: Secondary | ICD-10-CM | POA: Insufficient documentation

## 2023-01-25 DIAGNOSIS — Z7985 Long-term (current) use of injectable non-insulin antidiabetic drugs: Secondary | ICD-10-CM | POA: Insufficient documentation

## 2023-01-25 DIAGNOSIS — D122 Benign neoplasm of ascending colon: Secondary | ICD-10-CM | POA: Insufficient documentation

## 2023-01-25 DIAGNOSIS — R195 Other fecal abnormalities: Secondary | ICD-10-CM

## 2023-01-25 DIAGNOSIS — K760 Fatty (change of) liver, not elsewhere classified: Secondary | ICD-10-CM | POA: Insufficient documentation

## 2023-01-25 DIAGNOSIS — I10 Essential (primary) hypertension: Secondary | ICD-10-CM | POA: Diagnosis not present

## 2023-01-25 DIAGNOSIS — D126 Benign neoplasm of colon, unspecified: Secondary | ICD-10-CM

## 2023-01-25 HISTORY — PX: HEMOSTASIS CLIP PLACEMENT: SHX6857

## 2023-01-25 HISTORY — PX: POLYPECTOMY: SHX5525

## 2023-01-25 HISTORY — PX: COLONOSCOPY WITH PROPOFOL: SHX5780

## 2023-01-25 LAB — GLUCOSE, CAPILLARY: Glucose-Capillary: 136 mg/dL — ABNORMAL HIGH (ref 70–99)

## 2023-01-25 SURGERY — COLONOSCOPY WITH PROPOFOL
Anesthesia: General

## 2023-01-25 MED ORDER — PROPOFOL 10 MG/ML IV BOLUS
INTRAVENOUS | Status: DC | PRN
Start: 2023-01-25 — End: 2023-01-25
  Administered 2023-01-25: 50 mg via INTRAVENOUS

## 2023-01-25 MED ORDER — LIDOCAINE HCL (CARDIAC) PF 100 MG/5ML IV SOSY
PREFILLED_SYRINGE | INTRAVENOUS | Status: DC | PRN
  Administered 2023-01-25: 50 mg via INTRAVENOUS

## 2023-01-25 MED ORDER — PROPOFOL 500 MG/50ML IV EMUL
INTRAVENOUS | Status: DC | PRN
  Administered 2023-01-25: 75 ug/kg/min via INTRAVENOUS

## 2023-01-25 MED ORDER — DEXMEDETOMIDINE HCL IN NACL 80 MCG/20ML IV SOLN
INTRAVENOUS | Status: DC | PRN
  Administered 2023-01-25 (×2): 8 ug via INTRAVENOUS

## 2023-01-25 MED ORDER — SODIUM CHLORIDE 0.9 % IV SOLN: INTRAVENOUS | Status: DC

## 2023-01-25 NOTE — Op Note (Signed)
[facility] Gastroenterology Patient Name: Karen Estrada Procedure Date: 01/25/2023 9:34 AM MRN: [id number] Account #: [id number] Date of Birth: [birth year] Admit Type: Outpatient Age: 52 Room: [facility] ENDO ROOM 1 Gender: Female Note Status: Finalized Instrument Name: Prentice Docker [id number] Procedure:             Colonoscopy Indications:           Screening for colorectal malignant neoplasm due to                         positive fecal immunochemical test Providers:             Wyline Mood MD, MD Medicines:             Monitored Anesthesia Care Complications:         No immediate complications. Procedure:             Pre-Anesthesia Assessment:                        - Prior to the procedure, a History and Physical was                         performed, and patient medications, allergies and                         sensitivities were reviewed. The patient's tolerance                         of previous anesthesia was reviewed.                        - The risks and benefits of the procedure and the                         sedation options and risks were discussed with the                         patient. All questions were answered and informed                         consent was obtained.                        - ASA Grade Assessment: II - A patient with mild                         systemic disease.                        After obtaining informed consent, the colonoscope was                         passed under direct vision. Throughout the procedure,                         the patient's blood pressure, pulse, and oxygen                         saturations were monitored continuously. The  Colonoscope was introduced through the anus and                         advanced to the the cecum, identified by the                         appendiceal orifice. The colonoscopy was performed                         with ease. The patient tolerated the  procedure well.                         The quality of the bowel preparation was good. The                         ileocecal valve, appendiceal orifice, and rectum were                         photographed. Findings:      The perianal and digital rectal examinations were normal.      Two sessile polyps were found in the rectum. The polyps were 4 to 5 mm       in size. These polyps were removed with a cold snare. Resection and       retrieval were complete.      A 4 mm polyp was found in the rectum. The polyp was sessile. The polyp       was removed with a jumbo cold forceps. Resection and retrieval were       complete.      An 8 mm polyp was found in the ascending colon. The polyp was sessile.       The polyp was removed with a cold snare. Resection and retrieval were       complete. To prevent bleeding post-intervention, one hemostatic clip was       successfully placed. There was no bleeding at the end of the procedure.      The exam was otherwise without abnormality on direct and retroflexion       views. Impression:            - Two 4 to 5 mm polyps in the rectum, removed with a                         cold snare. Resected and retrieved.                        - One 4 mm polyp in the rectum, removed with a jumbo                         cold forceps. Resected and retrieved.                        - One 8 mm polyp in the ascending colon, removed with                         a cold snare. Resected and retrieved. Clip was placed.                        - The examination was  otherwise normal on direct and                         retroflexion views. Recommendation:        - Discharge patient to home (with escort).                        - Resume previous diet.                        - Continue present medications.                        - Await pathology results.                        - Repeat colonoscopy for surveillance based on                         pathology results. Procedure  Code(s):     --- Professional ---                        [phone], Colonoscopy, flexible; with removal of                         tumor(s), polyp(s), or other lesion(s) by snare                         technique                        45380, 59, Colonoscopy, flexible; with biopsy, single                         or multiple Diagnosis Code(s):     --- Professional ---                        Z12.11, Encounter for screening for malignant neoplasm                         of colon                        R19.5, Other fecal abnormalities                        D12.8, Benign neoplasm of rectum                        D12.2, Benign neoplasm of ascending colon CPT copyright 2022 American Medical Association. All rights reserved. The codes documented in this report are preliminary and upon coder review may  be revised to meet current compliance requirements. Wyline Mood, MD Wyline Mood MD, MD 01/25/2023 10:12:46 AM This report has been signed electronically. Number of Addenda: 0 Note Initiated On: 01/25/2023 9:34 AM Scope Withdrawal Time: 0 hours 10 minutes 46 seconds  Total Procedure Duration: 0 hours 22 minutes 9 seconds  Estimated Blood Loss:  Estimated blood loss: none.      [facility]

## 2023-01-25 NOTE — Anesthesia Procedure Notes (Signed)
Date/Time: 01/25/2023 9:50 AM  Performed by: Elmarie Mainland, CRNAPre-anesthesia Checklist: Patient identified, Emergency Drugs available, Suction available and Patient being monitored Patient Re-evaluated:Patient Re-evaluated prior to induction Oxygen Delivery Method: Simple face mask

## 2023-01-25 NOTE — OR Nursing (Signed)
Refused to give urine for pregnancy test, states "there is no way I can get pregnant my tubes are tight."

## 2023-01-25 NOTE — Anesthesia Preprocedure Evaluation (Signed)
Anesthesia Evaluation  Patient identified by MRN, date of birth, ID band Patient awake    Reviewed: Allergy & Precautions, NPO status , Patient's Chart, lab work & pertinent test results  History of Anesthesia Complications Negative for: history of anesthetic complications  Airway Mallampati: III  TM Distance: <3 FB Neck ROM: full    Dental  (+) Chipped, Poor Dentition, Missing   Pulmonary neg shortness of breath, former smoker   Pulmonary exam normal        Cardiovascular Exercise Tolerance: Good hypertension, (-) angina Normal cardiovascular exam     Neuro/Psych  PSYCHIATRIC DISORDERS       Neuromuscular disease    GI/Hepatic Neg liver ROS,GERD  ,,  Endo/Other  diabetes, Type 2Hypothyroidism    Renal/GU negative Renal ROS  negative genitourinary   Musculoskeletal   Abdominal   Peds  Hematology negative hematology ROS (+)   Anesthesia Other Findings Past Medical History: No date: Diabetes mellitus without complication (HCC) 03/11/2018: Fatty liver No date: GERD (gastroesophageal reflux disease) No date: History of measles No date: Hyperlipidemia No date: PCOS (polycystic ovarian syndrome)  Past Surgical History: No date: NOVASURE ABLATION 07/28/2010: pap smear     Comment:  [facility] 09/04/2010: TUBAL LIGATION     Comment:  Laparoscopic, Dr. Tiburcio Pea; with Endometrial ablation and               D&C  BMI    Body Mass Index: 56.93 kg/m      Reproductive/Obstetrics negative OB ROS                             Anesthesia Physical Anesthesia Plan  ASA: 3  Anesthesia Plan: General   Post-op Pain Management:    Induction: Intravenous  PONV Risk Score and Plan: Propofol infusion and TIVA  Airway Management Planned: Natural Airway and Nasal Cannula  Additional Equipment:   Intra-op Plan:   Post-operative Plan:   Informed Consent: I have reviewed the patients  History and Physical, chart, labs and discussed the procedure including the risks, benefits and alternatives for the proposed anesthesia with the patient or authorized representative who has indicated his/her understanding and acceptance.     Dental Advisory Given  Plan Discussed with: Anesthesiologist, CRNA and Surgeon  Anesthesia Plan Comments: (Patient consented for risks of anesthesia including but not limited to:  - adverse reactions to medications - risk of airway placement if required - damage to eyes, teeth, lips or other oral mucosa - nerve damage due to positioning  - sore throat or hoarseness - Damage to heart, brain, nerves, lungs, other parts of body or loss of life  Patient voiced understanding.)       Anesthesia Quick Evaluation

## 2023-01-25 NOTE — Transfer of Care (Signed)
Immediate Anesthesia Transfer of Care Note  Patient: Karen Estrada  Procedure(s) Performed: COLONOSCOPY WITH PROPOFOL POLYPECTOMY HEMOSTASIS CLIP PLACEMENT  Patient Location: PACU and Endoscopy Unit  Anesthesia Type:General  Level of Consciousness: awake, drowsy, and patient cooperative  Airway & Oxygen Therapy: Patient Spontanous Breathing  Post-op Assessment: Report given to RN and Post -op Vital signs reviewed and stable  Post vital signs: Reviewed and stable  Last Vitals:  Vitals Value Taken Time  BP 110/67 01/25/23 1011  Temp    Pulse 86 01/25/23 1011  Resp 16 01/25/23 1011  SpO2 100 % 01/25/23 1011    Last Pain:  Vitals:   01/25/23 1011  TempSrc:   PainSc: Asleep         Complications: No notable events documented.

## 2023-01-25 NOTE — Anesthesia Postprocedure Evaluation (Signed)
Anesthesia Post Note  Patient: Karen Estrada  Procedure(s) Performed: COLONOSCOPY WITH PROPOFOL POLYPECTOMY HEMOSTASIS CLIP PLACEMENT  Patient location during evaluation: Endoscopy Anesthesia Type: General Level of consciousness: awake and alert Pain management: pain level controlled Vital Signs Assessment: post-procedure vital signs reviewed and stable Respiratory status: spontaneous breathing, nonlabored ventilation, respiratory function stable and patient connected to nasal cannula oxygen Cardiovascular status: blood pressure returned to baseline and stable Postop Assessment: no apparent nausea or vomiting Anesthetic complications: no   No notable events documented.   Last Vitals:  Vitals:   01/25/23 1021 01/25/23 1031  BP: 120/76 119/80  Pulse: 78 77  Resp: 18 13  Temp:    SpO2: 100% 98%    Last Pain:  Vitals:   01/25/23 1031  TempSrc:   PainSc: 0-No pain                 Cleda Mccreedy Erick Murin

## 2023-01-25 NOTE — H&P (Signed)
Wyline Mood, MD [address], [place], [place], [address] [address], [address], [place], [place], [address] Phone: [phone]  Fax: [phone]  Primary Care Physician:  Malva Limes, MD   Pre-Procedure History & Physical: HPI:  Karen Estrada is a 52 y.o. female is here for an colonoscopy.   Past Medical History:  Diagnosis Date   Diabetes mellitus without complication (HCC)    Fatty liver 03/11/2018   GERD (gastroesophageal reflux disease)    History of measles    Hyperlipidemia    PCOS (polycystic ovarian syndrome)     Past Surgical History:  Procedure Laterality Date   NOVASURE ABLATION     pap smear  07/28/2010   [facility]   TUBAL LIGATION  09/04/2010   Laparoscopic, Dr. Tiburcio Pea; with Endometrial ablation and D&C    Prior to Admission medications   Medication Sig Start Date End Date Taking? Authorizing Provider  cyclobenzaprine (FLEXERIL) 5 MG tablet Take 1 tablet by mouth three times daily as needed for muscle spasm 12/26/22  Yes Malva Limes, MD  levothyroxine (SYNTHROID) 175 MCG tablet Take 1 tablet by mouth once daily 04/07/22  Yes Malva Limes, MD  lisinopril (ZESTRIL) 10 MG tablet Take 1 tablet by mouth once daily 05/10/22  Yes Malva Limes, MD  metaxalone [facility]) 800 MG tablet Take 1 tablet by mouth three times daily as needed for muscle spasm 01/18/23  Yes Malva Limes, MD  Milk Thistle 140 MG CAPS Take 1 capsule by mouth daily.  09/13/17  Yes [provider]  predniSONE (DELTASONE) 10 MG tablet Take 1 tablet (10 mg total) by mouth daily. 11/01/22  Yes Malva Limes, MD  sertraline (ZOLOFT) 100 MG tablet Take 1 tablet (100 mg total) by mouth daily. 10/30/22  Yes Malva Limes, MD  valACYclovir (VALTREX) 1000 MG tablet 2 tablets onset of cold sore, take 2 more tablets twelve hours later. 01/12/20  Yes Malva Limes, MD  Vitamin D, Ergocalciferol, (DRISDOL) 1.25 MG (50000 UNIT) CAPS capsule Take 1  capsule by mouth once a week 05/10/22  Yes Malva Limes, MD  Coenzyme Q10 (COQ10) 100 MG CAPS Take 1 tablet by mouth daily.    [provider]  colestipol (COLESTID) 1 g tablet Take 1 tablet by mouth twice daily Patient not taking: Reported on 01/25/2023 01/15/22   Malva Limes, MD  Evolocumab (REPATHA SURECLICK) 140 MG/ML SOAJ Inject 140 mg into the skin every 14 (fourteen) days. 02/03/22   Malva Limes, MD  glucose blood (ONETOUCH VERIO) test strip Use as instructed to check sugar three times daily for type 2 diabetes E11.9 05/24/20   Malva Limes, MD  HYDROcodone-acetaminophen (NORCO/VICODIN) 5-325 MG tablet Take 1 tablet by mouth every 6 (six) hours as needed for moderate pain. 01/17/23   Malva Limes, MD  Lancet Devices (ONE TOUCH DELICA LANCING DEV) MISC Use to check sugar three times daily for type 2 diabetes E11.9 05/24/20   Malva Limes, MD  metFORMIN (GLUCOPHAGE) 1000 MG tablet TAKE 1/2 (ONE-HALF) TABLET BY MOUTH IN THE MORNING AND 1 IN THE EVENING Patient not taking: Reported on 12/07/2022 12/15/18   Malva Limes, MD  naproxen (NAPROSYN) 500 MG tablet TAKE 1 TABLET BY MOUTH TWICE DAILY AS NEEDED Patient not taking: Reported on 12/07/2022 04/10/18   Malva Limes, MD  omeprazole (PRILOSEC) 20 MG capsule Take 20 mg by mouth daily.  [provider]  ondansetron (ZOFRAN) 4 MG tablet TAKE 1 TABLET BY MOUTH EVERY 8 HOURS AS NEEDED FOR NAUSEA 10/15/22   Malva Limes, MD  promethazine (PHENERGAN) 25 MG tablet TAKE 1 TABLET BY MOUTH EVERY 6 HOURS AS NEEDED 12/15/18   Malva Limes, MD  Semaglutide, 2 MG/DOSE, (OZEMPIC, 2 MG/DOSE,) 8 MG/3ML SOPN INJECT 2MG  SUBCUTANEOUSLY ONCE A WEEK 10/28/21   Malva Limes, MD  Semaglutide, 2 MG/DOSE, 8 MG/3ML SOPN Inject 2 mg as directed once a week. 10/30/22   Malva Limes, MD  triamcinolone cream (KENALOG) 0.5 % APPLY CREAM TOPICALLY TO AFFECTED AREA TWICE DAILY, AS DIRECTED 07/05/17   Malva Limes, MD   zolpidem (AMBIEN) 10 MG tablet Take 1 tablet (10 mg total) by mouth at bedtime as needed. for sleep Patient not taking: Reported on 12/07/2022 07/18/18   Malva Limes, MD    Allergies as of 12/07/2022 - Review Complete 12/07/2022  Allergen Reaction Noted   Atorvastatin Other (See Comments) 04/16/2020   Fluticasone Other (See Comments) 08/28/2015   Lovastatin Other (See Comments) 05/25/2014   Pioglitazone Diarrhea 07/15/2018   Rosuvastatin Other (See Comments) 11/01/2020   Zetia [ezetimibe]  03/30/2021    Family History  Problem Relation Age of Onset   CAD Mother    Stroke Mother    Diabetes Mother        type 2   Congestive Heart Failure Mother    Restless legs syndrome Mother    Heart disease Mother    CAD Father    Diabetes Father    Diabetes Paternal Aunt    Diabetes Maternal Grandmother    Diabetes Paternal Grandmother    Diabetes Paternal Uncle     Social History   Socioeconomic History   Marital status: Single    Spouse name: Not on file   Number of children: 1   Years of education: Not on file   Highest education level: Some college, no degree  Occupational History   Not on file  Tobacco Use   Smoking status: Former    Current packs/day: 0.00    Average packs/day: 0.8 packs/day for 9.0 years (6.8 ttl pk-yrs)    Types: Cigarettes    Start date: 04/07/2007    Quit date: 04/06/2016    Years since quitting: 6.8   Smokeless tobacco: Never  Vaping Use   Vaping status: Never Used  Substance and Sexual Activity   Alcohol use: No    Alcohol/week: 0.0 standard drinks of alcohol   Drug use: No   Sexual activity: Not on file  Other Topics Concern   Not on file  Social History Narrative   Not on file   Social Determinants of Health   Financial Resource Strain: Low Risk  (10/27/2022)   Overall Financial Resource Strain (CARDIA)    Difficulty of Paying Living Expenses: Not hard at all  Food Insecurity: No Food Insecurity (10/27/2022)   Hunger Vital Sign     Worried About Running Out of Food in the Last Year: Never true    Ran Out of Food in the Last Year: Never true  Transportation Needs: No Transportation Needs (10/27/2022)   PRAPARE - Administrator, Civil Service (Medical): No    Lack of Transportation (Non-Medical): No  Physical Activity: Unknown (10/27/2022)   Exercise Vital Sign    Days of Exercise per Week: 0 days    Minutes of Exercise per Session: Not on file  Stress: Stress Concern  Present (10/27/2022)   [employer] of Occupational Health - Occupational Stress Questionnaire    Feeling of Stress : To some extent  Social Connections: Moderately Integrated (10/27/2022)   Social Connection and Isolation Panel [NHANES]    Frequency of Communication with Friends and Family: More than three times a week    Frequency of Social Gatherings with Friends and Family: More than three times a week    Attends Religious Services: More than 4 times per year    Active Member of [employer] or Organizations: Yes    Attends Engineer, structural: More than 4 times per year    Marital Status: Never married  Intimate Partner Violence: Not on file    Review of Systems: See HPI, otherwise negative ROS  Physical Exam: There were no vitals taken for this visit. General:   Alert,  pleasant and cooperative in NAD Head:  Normocephalic and atraumatic. Neck:  Supple; no masses or thyromegaly. Lungs:  Clear throughout to auscultation, normal respiratory effort.    Heart:  +S1, +S2, Regular rate and rhythm, No edema. Abdomen:  Soft, nontender and nondistended. Normal bowel sounds, without guarding, and without rebound.   Neurologic:  Alert and  oriented x4;  grossly normal neurologically.  Impression/Plan: Karen Estrada is here for an colonoscopy to be performed for Screening colonoscopy positive fit test    Risks, benefits, limitations, and alternatives regarding  colonoscopy have been reviewed with the patient.  Questions have been  answered.  All parties agreeable.   Wyline Mood, MD  01/25/2023, 9:04 AM

## 2023-01-26 ENCOUNTER — Encounter: Payer: Self-pay | Admitting: Gastroenterology

## 2023-01-28 ENCOUNTER — Encounter: Payer: Self-pay | Admitting: Gastroenterology

## 2023-01-29 ENCOUNTER — Encounter: Payer: Self-pay | Admitting: Family Medicine

## 2023-02-03 NOTE — Telephone Encounter (Signed)
Patient can have 2 sample pens of Ozempic. I don't think we have any Repatha.

## 2023-02-12 ENCOUNTER — Other Ambulatory Visit: Payer: Self-pay | Admitting: Family Medicine

## 2023-02-12 DIAGNOSIS — M791 Myalgia, unspecified site: Secondary | ICD-10-CM

## 2023-02-12 DIAGNOSIS — R700 Elevated erythrocyte sedimentation rate: Secondary | ICD-10-CM

## 2023-02-12 DIAGNOSIS — R748 Abnormal levels of other serum enzymes: Secondary | ICD-10-CM

## 2023-02-12 NOTE — Telephone Encounter (Signed)
Requested medication (s) are due for refill today - no  Requested medication (s) are on the active medication list -yes  Future visit scheduled -no  Last refill: 11/01/22 #30 3RF  Notes to clinic: non delegated Rx  Requested Prescriptions  Pending Prescriptions Disp Refills   predniSONE (DELTASONE) 10 MG tablet [Pharmacy Med Name: predniSONE 10 MG Oral Tablet] 30 tablet 0    Sig: Take 1 tablet by mouth once daily     Not Delegated - Endocrinology:  Oral Corticosteroids Failed - 02/12/2023  5:03 AM      Failed - This refill cannot be delegated      Failed - Manual Review: Eye exam for IOP if prolonged treatment      Failed - Glucose (serum) in normal range and within 180 days    Glucose  Date Value Ref Range Status  10/30/2022 83 70 - 99 mg/dL Final  69/62/9528 413 mg/dL Final   Glucose-Capillary  Date Value Ref Range Status  01/25/2023 136 (H) 70 - 99 mg/dL Final    Comment:    Glucose reference range applies only to samples taken after fasting for at least 8 hours.         Passed - K in normal range and within 180 days    Potassium  Date Value Ref Range Status  10/30/2022 3.9 3.5 - 5.2 mmol/L Final         Passed - Na in normal range and within 180 days    Sodium  Date Value Ref Range Status  10/30/2022 142 134 - 144 mmol/L Final         Passed - Last BP in normal range    BP Readings from Last 1 Encounters:  01/25/23 119/80         Passed - Valid encounter within last 6 months    Recent Outpatient Visits           3 months ago Diabetes mellitus with coincident hypertension (HCC)   [place] [facility] Malva Limes, MD   1 year ago Type 2 diabetes mellitus without complication, without long-term current use of insulin (HCC)   [facility] Malva Limes, MD   1 year ago Diabetes mellitus with coincident hypertension (HCC)   [place] [facility] Malva Limes, MD   1 year ago Diabetes  mellitus with coincident hypertension (HCC)   [facility] Malva Limes, MD   1 year ago Nausea and vomiting, intractability of vomiting not specified, unspecified vomiting type   [facility] Malva Limes, MD              Passed - Bone Mineral Density or Dexa Scan completed in the last 2 years         Requested Prescriptions  Pending Prescriptions Disp Refills   predniSONE (DELTASONE) 10 MG tablet [Pharmacy Med Name: predniSONE 10 MG Oral Tablet] 30 tablet 0    Sig: Take 1 tablet by mouth once daily     Not Delegated - Endocrinology:  Oral Corticosteroids Failed - 02/12/2023  5:03 AM      Failed - This refill cannot be delegated      Failed - Manual Review: Eye exam for IOP if prolonged treatment      Failed - Glucose (serum) in normal range and within 180 days    Glucose  Date Value Ref Range Status  10/30/2022 83 70 - 99  mg/dL Final  40/98/1191 478 mg/dL Final   Glucose-Capillary  Date Value Ref Range Status  01/25/2023 136 (H) 70 - 99 mg/dL Final    Comment:    Glucose reference range applies only to samples taken after fasting for at least 8 hours.         Passed - K in normal range and within 180 days    Potassium  Date Value Ref Range Status  10/30/2022 3.9 3.5 - 5.2 mmol/L Final         Passed - Na in normal range and within 180 days    Sodium  Date Value Ref Range Status  10/30/2022 142 134 - 144 mmol/L Final         Passed - Last BP in normal range    BP Readings from Last 1 Encounters:  01/25/23 119/80         Passed - Valid encounter within last 6 months    Recent Outpatient Visits           3 months ago Diabetes mellitus with coincident hypertension (HCC)   [place] [facility] Malva Limes, MD   1 year ago Type 2 diabetes mellitus without complication, without long-term current use of insulin (HCC)   [place] [facility] Malva Limes, MD    1 year ago Diabetes mellitus with coincident hypertension (HCC)   [place] [facility] Malva Limes, MD   1 year ago Diabetes mellitus with coincident hypertension [facility])   [place] [facility] Malva Limes, MD   1 year ago Nausea and vomiting, intractability of vomiting not specified, unspecified vomiting type   [facility] Malva Limes, MD              Passed - Bone Mineral Density or Dexa Scan completed in the last 2 years

## 2023-02-16 ENCOUNTER — Other Ambulatory Visit: Payer: Self-pay | Admitting: Family Medicine

## 2023-02-16 ENCOUNTER — Encounter: Payer: Self-pay | Admitting: Family Medicine

## 2023-02-16 DIAGNOSIS — N946 Dysmenorrhea, unspecified: Secondary | ICD-10-CM

## 2023-02-16 MED ORDER — HYDROCODONE-ACETAMINOPHEN 5-325 MG PO TABS
1.0000 | ORAL_TABLET | Freq: Four times a day (QID) | ORAL | 0 refills | Status: DC | PRN
Start: 2023-02-16 — End: 2023-03-19

## 2023-03-06 ENCOUNTER — Encounter: Payer: Self-pay | Admitting: Family Medicine

## 2023-03-19 ENCOUNTER — Other Ambulatory Visit: Payer: Self-pay | Admitting: Family Medicine

## 2023-03-19 ENCOUNTER — Encounter: Payer: Self-pay | Admitting: Family Medicine

## 2023-03-19 DIAGNOSIS — N946 Dysmenorrhea, unspecified: Secondary | ICD-10-CM

## 2023-03-19 DIAGNOSIS — M353 Polymyalgia rheumatica: Secondary | ICD-10-CM

## 2023-03-19 MED ORDER — HYDROCODONE-ACETAMINOPHEN 5-325 MG PO TABS: 1.0000 | ORAL_TABLET | Freq: Four times a day (QID) | ORAL | 0 refills | Status: DC | PRN

## 2023-04-09 ENCOUNTER — Other Ambulatory Visit: Payer: Self-pay | Admitting: Family Medicine

## 2023-04-09 DIAGNOSIS — F32A Depression, unspecified: Secondary | ICD-10-CM

## 2023-04-14 ENCOUNTER — Other Ambulatory Visit: Payer: Self-pay | Admitting: Family Medicine

## 2023-04-14 DIAGNOSIS — M62838 Other muscle spasm: Secondary | ICD-10-CM

## 2023-04-14 DIAGNOSIS — M353 Polymyalgia rheumatica: Secondary | ICD-10-CM

## 2023-04-15 ENCOUNTER — Other Ambulatory Visit: Payer: Self-pay | Admitting: Family Medicine

## 2023-04-15 DIAGNOSIS — N946 Dysmenorrhea, unspecified: Secondary | ICD-10-CM

## 2023-04-15 NOTE — Telephone Encounter (Signed)
Requested medications are due for refill today.  yes  Requested medications are on the active medications list.  yes  Last refill. Cyclobenzaprine 03/19/2023 #30 0 rf. Metaxalone 01/18/2023 #30 2 rf  Future visit scheduled.   yes  Notes to clinic.  Refills are not delegated.    Requested Prescriptions  Pending Prescriptions Disp Refills   cyclobenzaprine (FLEXERIL) 5 MG tablet [Pharmacy Med Name: Cyclobenzaprine HCl 5 MG Oral Tablet] 30 tablet 0    Sig: Take 1 tablet by mouth three times daily as needed for muscle spasm     Not Delegated - Analgesics:  Muscle Relaxants Failed - 04/14/2023  3:54 PM      Failed - This refill cannot be delegated      Passed - Valid encounter within last 6 months    Recent Outpatient Visits           5 months ago Diabetes mellitus with coincident hypertension (HCC)   [place] [facility] Malva Limes, MD   1 year ago Type 2 diabetes mellitus without complication, without long-term current use of insulin (HCC)   [place] [facility] Malva Limes, MD   1 year ago Diabetes mellitus with coincident hypertension (HCC)   [place] [facility] Malva Limes, MD   1 year ago Diabetes mellitus with coincident hypertension (HCC)   [place] [facility] Malva Limes, MD   2 years ago Nausea and vomiting, intractability of vomiting not specified, unspecified vomiting type   [facility] Sherrie Mustache, Demetrios Isaacs, MD       Future Appointments             Tomorrow Malva Limes, MD [place] [facility]             metaxalone (SKELAXIN) 800 MG tablet [Pharmacy Med Name: Metaxalone 800 MG Oral Tablet] 30 tablet 0    Sig: Take 1 tablet by mouth three times daily as needed for muscle spasm     Not Delegated - Analgesics:  Muscle Relaxants Failed - 04/14/2023  3:54 PM      Failed - This refill cannot be delegated      Passed -  Valid encounter within last 6 months    Recent Outpatient Visits           5 months ago Diabetes mellitus with coincident hypertension (HCC)   [place] [facility] Malva Limes, MD   1 year ago Type 2 diabetes mellitus without complication, without long-term current use of insulin (HCC)   [place] [facility] Malva Limes, MD   1 year ago Diabetes mellitus with coincident hypertension (HCC)   [place] [facility] Malva Limes, MD   1 year ago Diabetes mellitus with coincident hypertension [facility])   [place] [facility] Malva Limes, MD   2 years ago Nausea and vomiting, intractability of vomiting not specified, unspecified vomiting type   [facility] Sherrie Mustache, Demetrios Isaacs, MD       Future Appointments             Tomorrow Sherrie Mustache, Demetrios Isaacs, MD  [facility]

## 2023-04-16 ENCOUNTER — Ambulatory Visit: Payer: 59 | Admitting: Family Medicine

## 2023-04-16 ENCOUNTER — Encounter: Payer: Self-pay | Admitting: Family Medicine

## 2023-04-16 VITALS — BP 140/86 | HR 87 | Ht 63.0 in | Wt 325.0 lb

## 2023-04-16 DIAGNOSIS — M353 Polymyalgia rheumatica: Secondary | ICD-10-CM

## 2023-04-16 DIAGNOSIS — E119 Type 2 diabetes mellitus without complications: Secondary | ICD-10-CM | POA: Diagnosis not present

## 2023-04-16 DIAGNOSIS — Z23 Encounter for immunization: Secondary | ICD-10-CM | POA: Diagnosis not present

## 2023-04-16 DIAGNOSIS — G720 Drug-induced myopathy: Secondary | ICD-10-CM

## 2023-04-16 DIAGNOSIS — E785 Hyperlipidemia, unspecified: Secondary | ICD-10-CM

## 2023-04-16 DIAGNOSIS — I10 Essential (primary) hypertension: Secondary | ICD-10-CM

## 2023-04-16 LAB — POCT GLYCOSYLATED HEMOGLOBIN (HGB A1C): Hemoglobin A1C: 5.7 % — AB (ref 4.0–5.6)

## 2023-04-16 NOTE — Patient Instructions (Signed)
.   Please review the attached list of medications and notify my office if there are any errors.   . Please bring all of your medications to every appointment so we can make sure that our medication list is the same as yours.   

## 2023-04-17 NOTE — Progress Notes (Unsigned)
Established patient visit   Patient: Karen Estrada   DOB: [birth year]   52 y.o. Female  MRN: [id number] Visit Date: 04/16/2023  Today's healthcare provider: Mila Merry, MD   Chief Complaint  Patient presents with   Follow-up   Subjective    Discussed the use of AI scribe software for clinical note transcription with the patient, who gave verbal consent to proceed.  History of Present Illness   The patient, with a history of diabetes, PMR, and hyperlipidemia, presents for a routine check-up and reports good control of her blood sugar levels, as evidenced by an A1c of 5.7. She has been experiencing leg cramps at night, which have been managed effectively with nightly Flexeril, allowing for better sleep.  The patient has been out of Repatha for a couple of months and is seeking alternatives due to insurance coverage issues. She has not experienced any chest pains, heart flutters, or shortness of breath.  The patient reports no swelling in her hands or feet. She has been taking precautions at work due to her prednisone use, including wearing a mask to avoid potential infections. She has received a pneumonia vaccine in the past.       Medications: Outpatient Medications Prior to Visit  Medication Sig   Coenzyme Q10 (COQ10) 100 MG CAPS Take 1 tablet by mouth daily.   cyclobenzaprine (FLEXERIL) 5 MG tablet Take 1 tablet by mouth three times daily as needed for muscle spasm   Evolocumab (REPATHA SURECLICK) 140 MG/ML SOAJ Inject 140 mg into the skin every 14 (fourteen) days.   glucose blood (ONETOUCH VERIO) test strip Use as instructed to check sugar three times daily for type 2 diabetes E11.9   HYDROcodone-acetaminophen (NORCO/VICODIN) 5-325 MG tablet Take 1 tablet by mouth every 6 (six) hours as needed for moderate pain. PATIENT NEEDS TO SCHEDULE OFFICE VISIT FOR FOLLOW UP   Lancet Devices (ONE TOUCH DELICA LANCING DEV) MISC Use to check sugar three times daily for type 2  diabetes E11.9   levothyroxine (SYNTHROID) 175 MCG tablet Take 1 tablet by mouth once daily   lisinopril (ZESTRIL) 10 MG tablet Take 1 tablet by mouth once daily   metaxalone (SKELAXIN) 800 MG tablet Take 1 tablet by mouth three times daily as needed for muscle spasm   Milk Thistle 140 MG CAPS Take 1 capsule by mouth daily.    naproxen (NAPROSYN) 500 MG tablet TAKE 1 TABLET BY MOUTH TWICE DAILY AS NEEDED   omeprazole (PRILOSEC) 20 MG capsule Take 20 mg by mouth daily.    ondansetron (ZOFRAN) 4 MG tablet TAKE 1 TABLET BY MOUTH EVERY 8 HOURS AS NEEDED FOR NAUSEA   predniSONE (DELTASONE) 10 MG tablet Take 1 tablet by mouth once daily   promethazine (PHENERGAN) 25 MG tablet TAKE 1 TABLET BY MOUTH EVERY 6 HOURS AS NEEDED   Semaglutide, 2 MG/DOSE, (OZEMPIC, 2 MG/DOSE,) 8 MG/3ML SOPN INJECT 2MG  SUBCUTANEOUSLY ONCE A WEEK   Semaglutide, 2 MG/DOSE, 8 MG/3ML SOPN Inject 2 mg as directed once a week.   sertraline (ZOLOFT) 100 MG tablet Take 1 tablet by mouth once daily   triamcinolone cream (KENALOG) 0.5 % APPLY CREAM TOPICALLY TO AFFECTED AREA TWICE DAILY, AS DIRECTED   valACYclovir (VALTREX) 1000 MG tablet 2 tablets onset of cold sore, take 2 more tablets twelve hours later.   Vitamin D, Ergocalciferol, (DRISDOL) 1.25 MG (50000 UNIT) CAPS capsule Take 1 capsule by mouth once a week   zolpidem (AMBIEN) 10 MG tablet  Take 1 tablet (10 mg total) by mouth at bedtime as needed. for sleep   [DISCONTINUED] colestipol (COLESTID) 1 g tablet Take 1 tablet by mouth twice daily (Patient not taking: Reported on 01/25/2023)   [DISCONTINUED] metFORMIN (GLUCOPHAGE) 1000 MG tablet TAKE 1/2 (ONE-HALF) TABLET BY MOUTH IN THE MORNING AND 1 IN THE EVENING (Patient not taking: Reported on 12/07/2022)   No facility-administered medications prior to visit.   Review of Systems {Insert previous labs (optional):23779} {See past labs  Heme  Chem  Endocrine  Serology  Results Review (optional):1}   Objective    BP (!) 140/86  (BP Location: Right Arm, Patient Position: Sitting, Cuff Size: Large)   Pulse 87   Ht 5\' 3"  (1.6 m)   Wt (!) 325 lb (147.4 kg)   SpO2 97%   BMI 57.57 kg/m {Insert last BP/Wt (optional):23777}{See vitals history (optional):1}  Physical Exam  Physical Exam   CHEST: Clear to auscultation bilaterally. EXTREMITIES: No edema.     Results for orders placed or performed in visit on 04/16/23  POCT HgB A1C  Result Value Ref Range   Hemoglobin A1C 5.7 (A) 4.0 - 5.6 %    Assessment & Plan        Diabetes Mellitus Well controlled with an A1c of 5.7.  -Continue current management.  Leg Cramps Improved with nightly Flexeril. -Continue Flexeril as needed for leg cramps.  Hyperlipidemia Patient has been off Repatha for a couple of months due to lack of samples. -Attempt to obtain more Repatha samples for the patient. -Check cholesterol levels to assess control in the absence of Repatha.   PMR On fairy low dose of prednisone but have not been able to wean further. Consider trial of methotrexate to reduce steroid exposure if not improving at follow up.   General Health Maintenance -Flu vaccine administered. -Continue to use mask at work due to prednisone use and increased risk of infection.    Return in about 6 months (around 10/14/2023).      Mila Merry, MD  [facility] [phone] (phone) [phone] (fax)  [facility]

## 2023-04-18 MED ORDER — HYDROCODONE-ACETAMINOPHEN 5-325 MG PO TABS
1.0000 | ORAL_TABLET | Freq: Four times a day (QID) | ORAL | 0 refills | Status: DC | PRN
Start: 2023-04-18 — End: 2023-05-22

## 2023-04-29 ENCOUNTER — Telehealth: Payer: Self-pay | Admitting: Family Medicine

## 2023-04-29 NOTE — Telephone Encounter (Signed)
Received fax from Cover My Meds for Ozempic 4mg /39ml pen injectors  KEY:  [id number]

## 2023-04-30 NOTE — Telephone Encounter (Signed)
PA initiated

## 2023-05-04 NOTE — Telephone Encounter (Signed)
Message from Plan This medication or product was previously approved on [id number] from 2022-05-27 to 2023-05-28. **Please note: This request was submitted electronically. Formulary lowering, tiering exception, cost reduction and/or pre-benefit determination review (including prospective Medicare hospice reviews) requests cannot be requested using this method of submission. Providers contact us at [phone] for further assistance.

## 2023-05-06 ENCOUNTER — Other Ambulatory Visit: Payer: Self-pay | Admitting: Family Medicine

## 2023-05-06 DIAGNOSIS — R748 Abnormal levels of other serum enzymes: Secondary | ICD-10-CM

## 2023-05-06 DIAGNOSIS — R700 Elevated erythrocyte sedimentation rate: Secondary | ICD-10-CM

## 2023-05-06 DIAGNOSIS — M791 Myalgia, unspecified site: Secondary | ICD-10-CM

## 2023-05-22 ENCOUNTER — Other Ambulatory Visit: Payer: Self-pay | Admitting: Family Medicine

## 2023-05-22 DIAGNOSIS — M353 Polymyalgia rheumatica: Secondary | ICD-10-CM

## 2023-05-22 DIAGNOSIS — I10 Essential (primary) hypertension: Secondary | ICD-10-CM

## 2023-05-22 DIAGNOSIS — E039 Hypothyroidism, unspecified: Secondary | ICD-10-CM

## 2023-05-22 DIAGNOSIS — M62838 Other muscle spasm: Secondary | ICD-10-CM

## 2023-05-22 DIAGNOSIS — N946 Dysmenorrhea, unspecified: Secondary | ICD-10-CM

## 2023-05-22 MED ORDER — HYDROCODONE-ACETAMINOPHEN 5-325 MG PO TABS: 1.0000 | ORAL_TABLET | Freq: Four times a day (QID) | ORAL | 0 refills | Status: DC | PRN

## 2023-05-23 ENCOUNTER — Encounter: Payer: Self-pay | Admitting: Family Medicine

## 2023-05-25 ENCOUNTER — Encounter: Payer: Self-pay | Admitting: Family Medicine

## 2023-06-17 ENCOUNTER — Other Ambulatory Visit: Payer: Self-pay | Admitting: Family Medicine

## 2023-06-17 DIAGNOSIS — N946 Dysmenorrhea, unspecified: Secondary | ICD-10-CM

## 2023-06-18 ENCOUNTER — Telehealth: Payer: Self-pay | Admitting: Family Medicine

## 2023-06-18 ENCOUNTER — Other Ambulatory Visit: Payer: Self-pay | Admitting: Family Medicine

## 2023-06-18 DIAGNOSIS — M353 Polymyalgia rheumatica: Secondary | ICD-10-CM

## 2023-06-18 DIAGNOSIS — R700 Elevated erythrocyte sedimentation rate: Secondary | ICD-10-CM

## 2023-06-18 DIAGNOSIS — E039 Hypothyroidism, unspecified: Secondary | ICD-10-CM

## 2023-06-18 DIAGNOSIS — E119 Type 2 diabetes mellitus without complications: Secondary | ICD-10-CM

## 2023-06-18 DIAGNOSIS — I10 Essential (primary) hypertension: Secondary | ICD-10-CM

## 2023-06-18 DIAGNOSIS — M791 Myalgia, unspecified site: Secondary | ICD-10-CM

## 2023-06-18 DIAGNOSIS — R748 Abnormal levels of other serum enzymes: Secondary | ICD-10-CM

## 2023-06-18 DIAGNOSIS — M62838 Other muscle spasm: Secondary | ICD-10-CM

## 2023-06-18 NOTE — Telephone Encounter (Signed)
 Medication Refill -  Most Recent Primary Care Visit:  Provider: GASPER GOLAS E  Department: [facility]  Visit Type: OFFICE VISIT  Date: 04/16/2023  Medication:  cyclobenzaprine  (FLEXERIL ) 5 MG tablet  metaxalone  (SKELAXIN ) 800 MG tablet  predniSONE  (DELTASONE ) 10 MG tablet  Semaglutide , 2 MG/DOSE, 8 MG/3ML SOPN  Evolocumab  (REPATHA  SURECLICK) 140 MG/ML SOAJ   Has the patient contacted their pharmacy? Yes (Agent: If no, request that the patient contact the pharmacy for the refill. If patient does not wish to contact the pharmacy document the reason why and proceed with request.) (Agent: If yes, when and what did the pharmacy advise?)  Is this the correct pharmacy for this prescription? Yes If no, delete pharmacy and type the correct one.  This is the patient's preferred pharmacy:    [id number] [place], [place] - [address] [place] [address] Phone: [phone] Fax: [phone]  Has the prescription been filled recently? Yes  Is the patient out of the medication? Yes  Has the patient been seen for an appointment in the last year OR does the patient have an upcoming appointment? Yes  Can we respond through MyChart? Yes  Agent: Please be advised that Rx refills may take up to 3 business days. We ask that you follow-up with your pharmacy.

## 2023-06-18 NOTE — Telephone Encounter (Unsigned)
 Copied from CRM [id number]. Topic: General - Other >> Jun 18, 2023  4:45 PM Everette C wrote: Reason for CRM: Medication Refill - Most Recent Primary Care Visit:  Provider: GASPER NANCYANN BRAVO Department: [facility] Visit Type: OFFICE VISIT Date: 04/16/2023  Medication: Vitamin D , Ergocalciferol , (DRISDOL ) 1.25 MG (50000 UNIT) CAPS capsule [[id number]]  Has the patient contacted their pharmacy? Yes (Agent: If no, request that the patient contact the pharmacy for the refill. If patient does not wish to contact the pharmacy document the reason why and proceed with request.) (Agent: If yes, when and what did the pharmacy advise?)  Is this the correct pharmacy for this prescription? Yes If no, delete pharmacy and type the correct one.  This is the patient's preferred pharmacy:  [id number] - [place], [place] - [address] [place] [address] Phone: [phone] Fax: [phone]   Has the prescription been filled recently? Yes  Is the patient out of the medication? Yes  Has the patient been seen for an appointment in the last year OR does the patient have an upcoming appointment? Yes  Can we respond through MyChart? No  Agent: Please be advised that Rx refills may take up to 3 business days. We ask that you follow-up with your pharmacy.

## 2023-06-19 MED ORDER — HYDROCODONE-ACETAMINOPHEN 5-325 MG PO TABS: 1.0000 | ORAL_TABLET | Freq: Four times a day (QID) | ORAL | 0 refills | Status: DC | PRN

## 2023-06-21 ENCOUNTER — Encounter: Payer: Self-pay | Admitting: Family Medicine

## 2023-06-21 ENCOUNTER — Other Ambulatory Visit: Payer: Self-pay | Admitting: Family Medicine

## 2023-06-21 DIAGNOSIS — M353 Polymyalgia rheumatica: Secondary | ICD-10-CM

## 2023-06-21 DIAGNOSIS — M62838 Other muscle spasm: Secondary | ICD-10-CM

## 2023-06-21 DIAGNOSIS — E119 Type 2 diabetes mellitus without complications: Secondary | ICD-10-CM

## 2023-06-22 ENCOUNTER — Other Ambulatory Visit: Payer: Self-pay

## 2023-06-22 DIAGNOSIS — E559 Vitamin D deficiency, unspecified: Secondary | ICD-10-CM

## 2023-06-22 MED ORDER — SEMAGLUTIDE (2 MG/DOSE) 8 MG/3ML ~~LOC~~ SOPN: 2.0000 mg | PEN_INJECTOR | SUBCUTANEOUS | 3 refills | Status: DC

## 2023-06-22 NOTE — Telephone Encounter (Signed)
 Requested medications are due for refill today.  yes  Requested medications are on the active medications list.  yes  Last refill. varied  Future visit scheduled.   yes  Notes to clinic.  Most are not delegated. Repatha  last prescribed 02/03/2022. Please review for refill.     Requested Prescriptions  Pending Prescriptions Disp Refills   cyclobenzaprine  (FLEXERIL ) 5 MG tablet 30 tablet 0    Sig: Take 1 tablet (5 mg total) by mouth 3 (three) times daily as needed. for muscle spams     Not Delegated - Analgesics:  Muscle Relaxants Failed - 06/22/2023 11:16 AM      Failed - This refill cannot be delegated      Passed - Valid encounter within last 6 months    Recent Outpatient Visits           2 months ago Diabetes mellitus with coincident hypertension (HCC)   [place] [facility] Gasper Nancyann BRAVO, MD   7 months ago Diabetes mellitus with coincident hypertension [facility])   [place] [facility] Gasper Nancyann BRAVO, MD   1 year ago Type 2 diabetes mellitus without complication, without long-term current use of insulin (HCC)   [place] [facility] Gasper Nancyann BRAVO, MD   1 year ago Diabetes mellitus with coincident hypertension (HCC)   [place] [facility] Gasper Nancyann BRAVO, MD   1 year ago Diabetes mellitus with coincident hypertension [facility])   [place] Gasper Nancyann BRAVO, MD       Future Appointments             In 3 months Fisher, Nancyann BRAVO, MD [facility]             Evolocumab  (REPATHA  SURECLICK) 140 MG/ML SOAJ      Sig: Inject 140 mg into the skin every 14 (fourteen) days.     Cardiovascular: PCSK9 Inhibitors Passed - 06/22/2023 11:16 AM      Passed - Valid encounter within last 12 months    Recent Outpatient Visits           2 months ago Diabetes mellitus with coincident hypertension (HCC)   [place] [facility] Gasper Nancyann BRAVO, MD   7 months ago Diabetes mellitus with coincident hypertension [facility])   [place] [facility] Gas[facility] Nancyann BRAVO, MD   1 year ago Type 2 diabetes mellitus without complication, without long-term current use of insulin (HCC)   [place] [facility] Gasper Nancyann BRAVO, MD   1 year ago Diabetes mellitus with coincident hypertension [facility])   [place] [facility] Gasper Nancyann BRAVO, MD   1 year ago Diabetes mellitus with coincident hypertension [facility])   [place] [facility] Gasper Nancyann BRAVO, MD       Future Appointments             In 3 months Fisher, Nancyann BRAVO, MD [facility]            Passed - Lipid Panel completed within the last 12 months    Cholesterol, Total  Date Value Ref Range Status  10/30/2022 163 100 - 199 mg/dL Final   LDL Chol Calc (NIH)  Date Value Ref Range Status  10/30/2022 82 0 - 99 mg/dL Final   HDL  Date Value Ref Range Status  10/30/2022 58 >39 mg/dL Final   Triglycerides  Date Value  Ref Range Status  10/30/2022 129 0 - 149 mg/dL Final          metaxalone  (SKELAXIN ) 800 MG tablet 30 tablet 0    Sig: Take 1 tablet (800 mg total) by mouth 3 (three) times daily as needed. for muscle spams     Not Delegated - Analgesics:  Muscle Relaxants Failed - 06/22/2023 11:16 AM      Failed - This refill cannot be delegated      Passed - Valid encounter within last 6 months    Recent Outpatient Visits           2 months ago Diabetes mellitus with coincident hypertension (HCC)   [place] [facility] Gasper Nancyann BRAVO, MD   7 months ago Diabetes mellitus with coincident hypertension [facility])   [place] [facility] Gasper Nancyann BRAVO, MD   1 year ago Type 2 diabetes mellitus without complication, without long-term current use of insulin (HCC)   [place] [facility] Gasper Nancyann BRAVO, MD   1 year ago Diabetes  mellitus with coincident hypertension (HCC)   [place] [facility] Gasper Nancyann BRAVO, MD   1 year ago Diabetes mellitus with coincident hypertension [facility])   [place] [facility] Gasper Nancyann BRAVO, MD       Future Appointments             In 3 months Fisher, Nancyann BRAVO, MD [place] [facility]             predniSONE  (DELTASONE ) 10 MG tablet 30 tablet 1    Sig: Take 1 tablet (10 mg total) by mouth daily.     Not Delegated - Endocrinology:  Oral Corticosteroids Failed - 06/22/2023 11:16 AM      Failed - This refill cannot be delegated      Failed - Manual Review: Eye exam for IOP if prolonged treatment      Failed - Glucose (serum) in normal range and within 180 days    Glucose  Date Value Ref Range Status  10/30/2022 83 70 - 99 mg/dL Final  96/96/7983 899 mg/dL Final   Glucose-Capillary  Date Value Ref Range Status  01/25/2023 136 (H) 70 - 99 mg/dL Final    Comment:    Glucose reference range applies only to samples taken after fasting for at least 8 hours.         Failed - K in normal range and within 180 days    Potassium  Date Value Ref Range Status  10/30/2022 3.9 3.5 - 5.2 mmol/L Final         Failed - Na in normal range and within 180 days    Sodium  Date Value Ref Range Status  10/30/2022 142 134 - 144 mmol/L Final         Failed - Last BP in normal range    BP Readings from Last 1 Encounters:  04/16/23 (!) 140/86         Passed - Valid encounter within last 6 months    Recent Outpatient Visits           2 months ago Diabetes mellitus with coincident hypertension (HCC)   [place] [facility] Gasper Nancyann BRAVO, MD   7 months ago Diabetes mellitus with coincident hypertension [facility])   [place] [facility] Gasper Nancyann BRAVO, MD   1 year ago Type 2 diabetes mellitus without complication, without long-term current use  of insulin [facility])   [place] [facility] Gasper Nancyann BRAVO, MD   1 year ago Diabetes mellitus with coincident hypertension [facility])   [place] [facility] Gasper Nancyann BRAVO, MD   1 year ago Diabetes mellitus with coincident hypertension [facility])   [place] [facility] Gasper Nancyann BRAVO, MD       Future Appointments             In 3 months Fisher, Nancyann BRAVO, MD [facility]            Passed - Bone Mineral Density or Dexa Scan completed in the last 2 years      Signed Prescriptions Disp Refills   Semaglutide , 2 MG/DOSE, 8 MG/3ML SOPN 3 mL 3    Sig: Inject 2 mg as directed once a week.     Endocrinology:  Diabetes - GLP-1 Receptor Agonists - semaglutide  Failed - 06/22/2023 11:16 AM      Failed - HBA1C in normal range and within 180 days    Hemoglobin A1C  Date Value Ref Range Status  04/16/2023 5.7 (A) 4.0 - 5.6 % Final   Hgb A1c MFr Bld  Date Value Ref Range Status  10/30/2022 6.0 (H) 4.8 - 5.6 % Final    Comment:             Prediabetes: 5.7 - 6.4          Diabetes: >6.4          Glycemic control for adults with diabetes: <7.0          Passed - Cr in normal range and within 360 days    Creatinine, Ser  Date Value Ref Range Status  10/30/2022 0.76 0.57 - 1.00 mg/dL Final         Passed - Valid encounter within last 6 months    Recent Outpatient Visits           2 months ago Diabetes mellitus with coincident hypertension (HCC)   [place] [facility] Gasper Nancyann BRAVO, MD   7 months ago Diabetes mellitus with coincident hypertension [facility])   [place] [facility] Gasper Nancyann BRAVO, MD   1 year ago Type 2 diabetes mellitus without complication, without long-term current use of insulin (HCC)   [place] [facility] Gasper Nancyann BRAVO, MD   1 year ago Diabetes mellitus with coincident hypertension [facility])   [place] [facility] Gasper Nancyann BRAVO, MD   1 year ago Diabetes mellitus  with coincident hypertension [facility])   [place] [facility] Gasper Nancyann BRAVO, MD       Future Appointments             In 3 months Fisher, Nancyann BRAVO, MD [facility], [facility]

## 2023-06-22 NOTE — Telephone Encounter (Signed)
 Requested Prescriptions  Pending Prescriptions Disp Refills   cyclobenzaprine  (FLEXERIL ) 5 MG tablet 30 tablet 0    Sig: Take 1 tablet (5 mg total) by mouth 3 (three) times daily as needed. for muscle spams     Not Delegated - Analgesics:  Muscle Relaxants Failed - 06/22/2023 11:15 AM      Failed - This refill cannot be delegated      Passed - Valid encounter within last 6 months    Recent Outpatient Visits           2 months ago Diabetes mellitus with coincident hypertension (HCC)   [place] [facility] Gasper Nancyann BRAVO, MD   7 months ago Diabetes mellitus with coincident hypertension [facility])   [place] [facility] Gasper Nancyann BRAVO, MD   1 year ago Type 2 diabetes mellitus without complication, without long-term current use of insulin (HCC)   [place] [facility] Gasper Nancyann BRAVO, MD   1 year ago Diabetes mellitus with coincident hypertension (HCC)   [place] [facility] Gasper Nancyann BRAVO, MD   1 year ago Diabetes mellitus with coincident hypertension [facility])   [place] [facility] Gasper Nancyann BRAVO, MD       Future Appointments             In 3 months Fisher, Nancyann BRAVO, [facility]             Evolocumab  (REPATHA  SURECLICK) 140 MG/ML SOAJ      Sig: Inject 140 mg into the skin every 14 (fourteen) days.     Cardiovascular: PCSK9 Inhibitors Passed - 06/22/2023 11:15 AM      Passed - Valid encounter within last 12 months    Recent Outpatient Visits           2 months ago Diabetes mellitus with coincident hypertension (HCC)   [place] [facility] Gasper Nancyann BRAVO, MD   7 months ago Diabetes mellitus with coincident hypertension [facility])   [place] [facility] Gasper Nancyann BRAVO, MD   1 year ago Type 2 diabetes mellitus without complication, without long-term current use of insulin (HCC)   [place] [facility]  Gasper Nancyann BRAVO, MD   1 year ago Diabetes mellitus with coincident hypertension (HCC)   [place] [facility] Gasper Nancyann BRAVO, MD   1 year ago Diabetes mellitus with coincident hypertension [facility])    [place] [facility] Gasper Nancyann BRAVO, MD       Future Appointments             In 3 months Fisher, Nancyann BRAVO, MD [facility]            Passed - Lipid Panel completed within the last 12 months    Cholesterol, Total  Date Value Ref Range Status  10/30/2022 163 100 - 199 mg/dL Final   LDL Chol Calc (NIH)  Date Value Ref Range Status  10/30/2022 82 0 - 99 mg/dL Final   HDL  Date Value Ref Range Status  10/30/2022 58 >39 mg/dL Final   Triglycerides  Date Value Ref Range Status  10/30/2022 129 0 - 149 mg/dL Final          metaxalone  (SKELAXIN ) 800 MG tablet 30 tablet 0    Sig: Take 1 tablet (800 mg total) by mouth 3 (three) times daily as needed. for muscle spams  Not Delegated - Analgesics:  Muscle Relaxants Failed - 06/22/2023 11:15 AM      Failed - This refill cannot be delegated      Passed - Valid encounter within last 6 months    Recent Outpatient Visits           2 months ago Diabetes mellitus with coincident hypertension (HCC)   [place] [facility] Gasper Nancyann BRAVO, MD   7 months ago Diabetes mellitus with coincident hypertension [facility])   [place] [facility] Gas[facility] Nancyann BRAVO, MD   1 year ago Type 2 diabetes mellitus without complication, without long-term current use of insulin (HCC)   [place] [facility] Gasper Nancyann BRAVO, MD   1 year ago Diabetes mellitus with coincident hypertension (HCC)   [place] [facility] Gasper Nancyann BRAVO, MD   1 year ago Diabetes mellitus with coincident hypertension [facility])   [place] [facility] Gasper Nancyann BRAVO, MD       Future Appointments             In 3 months  Fisher, Nancyann BRAVO, MD [place] [facility]             predniSONE  (DELTASONE ) 10 MG tablet 30 tablet 1    Sig: Take 1 tablet (10 mg total) by mouth daily.     Not Delegated - Endocrinology:  Oral Corticosteroids Failed - 06/22/2023 11:15 AM      Failed - This refill cannot be delegated      Failed - Manual Review: Eye exam for IOP if prolonged treatment      Failed - Glucose (serum) in normal range and within 180 days    Glucose  Date Value Ref Range Status  10/30/2022 83 70 - 99 mg/dL Final  96/96/7983 899 mg/dL Final   Glucose-Capillary  Date Value Ref Range Status  01/25/2023 136 (H) 70 - 99 mg/dL Final    Comment:    Glucose reference range applies only to samples taken after fasting for at least 8 hours.         Failed - K in normal range and within 180 days    Potassium  Date Value Ref Range Status  10/30/2022 3.9 3.5 - 5.2 mmol/L Final         Failed - Na in normal range and within 180 days    Sodium  Date Value Ref Range Status  10/30/2022 142 134 - 144 mmol/L Final         Failed - Last BP in normal range    BP Readings from Last 1 Encounters:  04/16/23 (!) 140/86         Passed - Valid encounter within last 6 months    Recent Outpatient Visits           2 months ago Diabetes mellitus with coincident hypertension (HCC)   [place] [facility] Gasper Nancyann BRAVO, MD   7 months ago Diabetes mellitus with coincident hypertension [facility])   [place] [facility] Gasper Nancyann BRAVO, MD   1 year ago Type 2 diabetes mellitus without complication, without long-term current use of insulin (HCC)   [place] [facility] Gasper Nancyann BRAVO, MD   1 year ago Diabetes mellitus with coincident hypertension [facility])    [facility] Gasper Nancyann BRAVO, MD   1 year ago Diabetes mellitus with coincident hypertension (HCC)    [facility] Nancyann BRAVO, MD        Future Appointments             In 3 months Fisher, Nancyann BRAVO, MD [facility]            Passed - Bone Mineral Density or Dexa Scan completed in the last 2 years       Semaglutide , 2 MG/DOSE, 8 MG/3ML SOPN 3 mL 3    Sig: Inject 2 mg as directed once a week.     Endocrinology:  Diabetes - GLP-1 Receptor Agonists - semaglutide  Failed - 06/22/2023 11:15 AM      Failed - HBA1C in normal range and within 180 days    Hemoglobin A1C  Date Value Ref Range Status  04/16/2023 5.7 (A) 4.0 - 5.6 % Final   Hgb A1c MFr Bld  Date Value Ref Range Status  10/30/2022 6.0 (H) 4.8 - 5.6 % Final    Comment:             Prediabetes: 5.7 - 6.4          Diabetes: >6.4          Glycemic control for adults with diabetes: <7.0          Passed - Cr in normal range and within 360 days    Creatinine, Ser  Date Value Ref Range Status  10/30/2022 0.76 0.57 - 1.00 mg/dL Final         Passed - Valid encounter within last 6 months    Recent Outpatient Visits           2 months ago Diabetes mellitus with coincident hypertension (HCC)   [place] [facility] Gas[facility] Nancyann BRAVO, MD   7 months ago Diabetes mellitus with coincident hypertension [facility])   [place] [facility] Gasper Nancyann BRAVO, MD   1 year ago Type 2 diabetes mellitus without complication, without long-term current use of insulin (HCC)   [place] [facility] Gasper Nancyann BRAVO, MD   1 year ago Diabetes mellitus with coincident hypertension [facility])   [place] [facility] Gasper Nancyann BRAVO, MD   1 year ago Diabetes mellitus with coincident hypertension [facility])   [place] [facility] Gasper Nancyann BRAVO, MD       Future Appointments             In 3 months Fisher, Nancyann BRAVO, MD [facility]

## 2023-06-23 ENCOUNTER — Telehealth: Payer: Self-pay | Admitting: Family Medicine

## 2023-06-23 MED ORDER — PREDNISONE 10 MG PO TABS: 10.0000 mg | ORAL_TABLET | Freq: Every day | ORAL | 3 refills | Status: DC

## 2023-06-23 MED ORDER — METAXALONE 800 MG PO TABS: 800.0000 mg | ORAL_TABLET | Freq: Three times a day (TID) | ORAL | 1 refills | Status: DC | PRN

## 2023-06-23 MED ORDER — VITAMIN D (ERGOCALCIFEROL) 1.25 MG (50000 UNIT) PO CAPS: 50000.0000 [IU] | ORAL_CAPSULE | ORAL | 12 refills | Status: DC

## 2023-06-23 MED ORDER — REPATHA SURECLICK 140 MG/ML ~~LOC~~ SOAJ: 140.0000 mg | SUBCUTANEOUS | 12 refills | Status: DC

## 2023-06-23 MED ORDER — CYCLOBENZAPRINE HCL 5 MG PO TABS: 5.0000 mg | ORAL_TABLET | Freq: Three times a day (TID) | ORAL | 2 refills | Status: DC | PRN

## 2023-06-23 NOTE — Telephone Encounter (Signed)
 PA submitted in Cover My Meds for Karen Estrada (Key: [id number]) PA Case ID #: [id number] Rx #: [id number]

## 2023-06-23 NOTE — Telephone Encounter (Signed)
 Recieved a fax from covermymeds for Repatha Sureclick 140mg /ml Auto-injectors..PA has been started for patient.   key: [id number]

## 2023-06-24 MED ORDER — OZEMPIC (2 MG/DOSE) 8 MG/3ML ~~LOC~~ SOPN: PEN_INJECTOR | SUBCUTANEOUS | 11 refills | Status: DC

## 2023-06-24 MED ORDER — CYCLOBENZAPRINE HCL 5 MG PO TABS: 5.0000 mg | ORAL_TABLET | Freq: Three times a day (TID) | ORAL | 2 refills | Status: DC | PRN

## 2023-06-24 MED ORDER — METAXALONE 800 MG PO TABS: 800.0000 mg | ORAL_TABLET | Freq: Three times a day (TID) | ORAL | 2 refills | Status: DC | PRN

## 2023-06-24 NOTE — Telephone Encounter (Signed)
  We're pleased to let you know that we've APPROVED your or your doctor's request for coverage for Repatha . You can now fill your prescription, and it will be covered according to your plan.  As long as you remain covered by your prescription drug plan and there are no changes to your plan benefits, this request is approved from 06/23/2023 to 06/22/2024. When this approval expires, please speak to your doctor about your treatment.  You will need to fill your specialty medications at a select participating pharmacy in your plan's network. Sincerely, [facility] cc: Dr. Nancyann Fluke

## 2023-06-24 NOTE — Telephone Encounter (Signed)
 Patient notified

## 2023-07-19 ENCOUNTER — Other Ambulatory Visit (HOSPITAL_COMMUNITY): Payer: Self-pay

## 2023-07-19 ENCOUNTER — Telehealth: Payer: Self-pay

## 2023-07-19 NOTE — Telephone Encounter (Signed)
Pharmacy Patient Advocate Encounter   Received notification from  [facility] Portal that prior authorization for Ozempic (2 MG/DOSE) 8MG /3ML pen-injectors is required/requested.   Insurance verification completed.   The patient is insured through [facility] .   Per test claim: PA required; PA started via CoverMyMeds. KEY [id number] . Waiting for clinical questions to populate.

## 2023-07-20 ENCOUNTER — Other Ambulatory Visit: Payer: Self-pay | Admitting: Family Medicine

## 2023-07-20 ENCOUNTER — Telehealth: Payer: Self-pay | Admitting: Family Medicine

## 2023-07-20 DIAGNOSIS — N946 Dysmenorrhea, unspecified: Secondary | ICD-10-CM

## 2023-07-20 MED ORDER — HYDROCODONE-ACETAMINOPHEN 5-325 MG PO TABS: 1.0000 | ORAL_TABLET | Freq: Four times a day (QID) | ORAL | 0 refills | Status: DC | PRN

## 2023-07-20 NOTE — Telephone Encounter (Signed)
Covermymeds is requesting prior authorization Key: [id number] HYDROcodone- Acetaminophen 5-325MG  tablets

## 2023-07-23 ENCOUNTER — Encounter: Payer: Self-pay | Admitting: Family Medicine

## 2023-07-23 ENCOUNTER — Other Ambulatory Visit (HOSPITAL_COMMUNITY): Payer: Self-pay

## 2023-07-23 NOTE — Telephone Encounter (Signed)
 Pharmacy Patient Advocate Encounter   Received notification from CoverMyMeds that prior authorization for Ozempic  (2 MG/DOSE) 8MG /3ML pen-injectors is required/requested.   Insurance verification completed.   The patient is insured through [facility] .   Per test claim: PA required; PA submitted to above mentioned insurance via CoverMyMeds Key/confirmation #/EOC [id number] Status is pending

## 2023-07-26 ENCOUNTER — Other Ambulatory Visit (HOSPITAL_COMMUNITY): Payer: Self-pay

## 2023-07-26 NOTE — Telephone Encounter (Signed)
 Pharmacy Patient Advocate Encounter  Received notification from [employer] that Prior Authorization for Ozempic  (2 MG/DOSE) 8MG /3ML pen-injectors has been APPROVED from 07/23/23 to 07/22/26. Unable to obtain price due to refill too soon rejection, last fill date 07/23/23 next available fill date03/01/25   PA #/Case ID/Reference #:  [id number]

## 2023-07-28 ENCOUNTER — Other Ambulatory Visit (HOSPITAL_COMMUNITY): Payer: Self-pay

## 2023-08-15 ENCOUNTER — Other Ambulatory Visit: Payer: Self-pay | Admitting: Family Medicine

## 2023-08-15 DIAGNOSIS — M62838 Other muscle spasm: Secondary | ICD-10-CM

## 2023-08-15 DIAGNOSIS — N946 Dysmenorrhea, unspecified: Secondary | ICD-10-CM

## 2023-08-16 MED ORDER — HYDROCODONE-ACETAMINOPHEN 5-325 MG PO TABS: 1.0000 | ORAL_TABLET | Freq: Four times a day (QID) | ORAL | 0 refills | Status: DC | PRN

## 2023-08-16 NOTE — Telephone Encounter (Signed)
 Requested medication (s) are due for refill today - yes  Requested medication (s) are on the active medication list -yes  Future visit scheduled -yes  Last refill: 06/23/23, 06/24/23 #30  Notes to clinic: non delegated Rx  Requested Prescriptions  Pending Prescriptions Disp Refills   metaxalone (SKELAXIN) 800 MG tablet [Pharmacy Med Name: METAXALONE 800 MG TABLET] 30 tablet 1    Sig: Take 1 tablet (800 mg total) by mouth 3 (three) times daily as needed. for muscle spams     Not Delegated - Analgesics:  Muscle Relaxants Failed - 08/16/2023  1:35 PM      Failed - This refill cannot be delegated      Passed - Valid encounter within last 6 months    Recent Outpatient Visits           4 months ago Diabetes mellitus with coincident hypertension (HCC)   [place] [facility]   9 months ago Diabetes mellitus with coincident hypertension [facility])   [place] [facility]va Limes, MD   1 year ago Type 2 diabetes mellitus without complication, without long-term current use of insulin (HCC)   [place] [facility] L[facility], MD   1 year ago Diabetes mellitus with coincident hypertension [facility])   [place] [facility] Malva Limes, MD   2 years ago Diabetes mellitus with coincident hypertension [facility])   [place] [facility] Malva Limes, MD       Future Appointments             In 2 months Fisher, Demetrios Isaacs, MD [facility]               Requested Prescriptions  Pending Prescriptions Disp Refills   metaxalone (SKELAXIN) 800 MG tablet [Pharmacy Med Name: METAXALONE 800 MG TABLET] 30 tablet 1    Sig: Take 1 tablet (800 mg total) by mouth 3 (three) times daily as needed. for muscle spams     Not Delegated - Analgesics:  Muscle Relaxants Failed - 08/16/2023  1:35 PM      Failed - This refill cannot be delegated      Passed - Valid  encounter within last 6 months    Recent Outpatient Visits           4 months ago Diabetes mellitus with coincident hypertension (HCC)   [place] [facility] MD   9 months ago Diabetes mellitus with coincident hypertension [facility])   [place] [facility] Limes, MD   1 year ago Type 2 diabetes mellitus without complication, without long-term current use of insulin (HCC)   [place] [facility] Malva Limes, MD   1 year ago Diabetes mellitus with coincident hypertension [facility])   [place] [facility] Malva Limes, MD   2 years ago Diabetes mellitus with coincident hypertension [facility])   [place] [facility] Malva Limes, MD       Future Appointments             In 2 months Fisher, Demetrios Isaacs, MD [facility]

## 2023-09-13 ENCOUNTER — Other Ambulatory Visit: Payer: Self-pay | Admitting: Family Medicine

## 2023-09-13 DIAGNOSIS — N946 Dysmenorrhea, unspecified: Secondary | ICD-10-CM

## 2023-09-13 DIAGNOSIS — M353 Polymyalgia rheumatica: Secondary | ICD-10-CM

## 2023-09-13 MED ORDER — HYDROCODONE-ACETAMINOPHEN 5-325 MG PO TABS: 1.0000 | ORAL_TABLET | Freq: Four times a day (QID) | ORAL | 0 refills | Status: DC | PRN

## 2023-10-08 ENCOUNTER — Other Ambulatory Visit: Payer: Self-pay | Admitting: Family Medicine

## 2023-10-08 DIAGNOSIS — E119 Type 2 diabetes mellitus without complications: Secondary | ICD-10-CM

## 2023-10-13 ENCOUNTER — Other Ambulatory Visit: Payer: Self-pay | Admitting: Physician Assistant

## 2023-10-13 ENCOUNTER — Other Ambulatory Visit: Payer: Self-pay | Admitting: Family Medicine

## 2023-10-13 DIAGNOSIS — M62838 Other muscle spasm: Secondary | ICD-10-CM

## 2023-10-13 DIAGNOSIS — N946 Dysmenorrhea, unspecified: Secondary | ICD-10-CM

## 2023-10-15 ENCOUNTER — Telehealth: Payer: Self-pay | Admitting: Family Medicine

## 2023-10-15 ENCOUNTER — Ambulatory Visit: Payer: Self-pay | Admitting: Family Medicine

## 2023-10-15 ENCOUNTER — Encounter: Payer: Self-pay | Admitting: Family Medicine

## 2023-10-15 VITALS — BP 123/77 | HR 78 | Resp 16 | Ht 63.0 in | Wt 322.2 lb

## 2023-10-15 DIAGNOSIS — I10 Essential (primary) hypertension: Secondary | ICD-10-CM

## 2023-10-15 DIAGNOSIS — F32A Depression, unspecified: Secondary | ICD-10-CM

## 2023-10-15 DIAGNOSIS — E1159 Type 2 diabetes mellitus with other circulatory complications: Secondary | ICD-10-CM | POA: Diagnosis not present

## 2023-10-15 DIAGNOSIS — E785 Hyperlipidemia, unspecified: Secondary | ICD-10-CM

## 2023-10-15 DIAGNOSIS — E119 Type 2 diabetes mellitus without complications: Secondary | ICD-10-CM

## 2023-10-15 DIAGNOSIS — M353 Polymyalgia rheumatica: Secondary | ICD-10-CM

## 2023-10-15 DIAGNOSIS — K760 Fatty (change of) liver, not elsewhere classified: Secondary | ICD-10-CM

## 2023-10-15 DIAGNOSIS — E559 Vitamin D deficiency, unspecified: Secondary | ICD-10-CM

## 2023-10-15 DIAGNOSIS — E039 Hypothyroidism, unspecified: Secondary | ICD-10-CM

## 2023-10-15 DIAGNOSIS — F5101 Primary insomnia: Secondary | ICD-10-CM

## 2023-10-15 DIAGNOSIS — R062 Wheezing: Secondary | ICD-10-CM

## 2023-10-15 MED ORDER — ALBUTEROL SULFATE HFA 108 (90 BASE) MCG/ACT IN AERS: 2.0000 | INHALATION_SPRAY | Freq: Four times a day (QID) | RESPIRATORY_TRACT | 2 refills | Status: DC | PRN

## 2023-10-15 MED ORDER — HYDROCODONE-ACETAMINOPHEN 5-325 MG PO TABS: 1.0000 | ORAL_TABLET | Freq: Four times a day (QID) | ORAL | 0 refills | Status: DC | PRN

## 2023-10-15 MED ORDER — ZOLPIDEM TARTRATE 10 MG PO TABS: 10.0000 mg | ORAL_TABLET | Freq: Every evening | ORAL | 2 refills | Status: AC | PRN

## 2023-10-15 NOTE — Telephone Encounter (Signed)
 [facility]  zolpidem  (AMBIEN ) 10 MG tablet   Please advise

## 2023-10-15 NOTE — Progress Notes (Unsigned)
 Established patient visit   Patient: Karen Estrada   DOB: 08-29-1970   53 y.o. Female  MRN: [id number] Visit Date: 10/15/2023  Today's healthcare provider: Jeralene Mom, MD   Chief Complaint  Patient presents with   Medical Management of Chronic Issues    T2DM   Subjective    HPI Follow up dm2, prm, hypothyroid, hyperlipidemia, htn, insomnia. She is intolerant to statins, but tolerating Repatha  well. She takes occasional zolpidem  which remains effects. She continue to have PMR muscles pains, but is tolerable on 10mg  daily prednisone . She went on new insurance plan this year which covers Repatha  and Ozempic  so has been taking both of those consistently since January.   Lab Results  Component Value Date   HGBA1C 5.7 (A) 04/16/2023   HGBA1C 6.0 (H) 10/30/2022   HGBA1C 5.6 10/24/2021   Lab Results  Component Value Date   CHOL 163 10/30/2022   HDL 58 10/30/2022   LDLCALC 82 10/30/2022   TRIG 129 10/30/2022   CHOLHDL 2.8 10/30/2022   Lab Results  Component Value Date   NA 142 10/30/2022   K 3.9 10/30/2022   CREATININE 0.76 10/30/2022   EGFR 94 10/30/2022   GLUCOSE 83 10/30/2022   Lab Results  Component Value Date   TSH 1.300 10/30/2022     Medications: utpatient Medications Prior to Visit  Medication Sig   Coenzyme Q10 (COQ10) 100 MG CAPS Take 1 tablet by mouth daily.   cyclobenzaprine  (FLEXERIL ) 5 MG tablet Take 1 tablet (5 mg total) by mouth 3 (three) times daily as needed. for muscle spams   cyclobenzaprine  (FLEXERIL ) 5 MG tablet TAKE 1 TABLET (5 MG TOTAL) BY MOUTH 3 (THREE) TIMES DAILY AS NEEDED. FOR MUSCLE SPAMS   Evolocumab  (REPATHA  SURECLICK) 140 MG/ML SOAJ Inject 140 mg into the skin every 14 (fourteen) days.   glucose blood (ONETOUCH VERIO) test strip Use as instructed to check sugar three times daily for type 2 diabetes E11.9   HYDROcodone -acetaminophen  (NORCO/VICODIN) 5-325 MG tablet Take 1 tablet by mouth every 6 (six) hours as needed for  moderate pain (pain score 4-6).   Lancet Devices (ONE TOUCH DELICA LANCING DEV) MISC Use to check sugar three times daily for type 2 diabetes E11.9   levothyroxine  (SYNTHROID ) 175 MCG tablet TAKE 1 TABLET BY MOUTH DAILY   lisinopril  (ZESTRIL ) 10 MG tablet TAKE 1 TABLET BY MOUTH DAILY   metaxalone  (SKELAXIN ) 800 MG tablet Take 1 tablet (800 mg total) by mouth 3 (three) times daily as needed. for muscle spams   metaxalone  (SKELAXIN ) 800 MG tablet TAKE 1 TABLET (800 MG TOTAL) BY MOUTH 3 (THREE) TIMES DAILY AS NEEDED. FOR MUSCLE SPAMS   Milk Thistle 140 MG CAPS Take 1 capsule by mouth daily.    naproxen  (NAPROSYN ) 500 MG tablet TAKE 1 TABLET BY MOUTH TWICE DAILY AS NEEDED   omeprazole (PRILOSEC) 20 MG capsule Take 20 mg by mouth daily.    ondansetron  (ZOFRAN ) 4 MG tablet TAKE 1 TABLET BY MOUTH EVERY 8 HOURS AS NEEDED FOR NAUSEA   predniSONE  (DELTASONE ) 10 MG tablet Take 1 tablet (10 mg total) by mouth daily.   promethazine  (PHENERGAN ) 25 MG tablet TAKE 1 TABLET BY MOUTH EVERY 6 HOURS AS NEEDED   Semaglutide , 2 MG/DOSE, (OZEMPIC , 2 MG/DOSE,) 8 MG/3ML SOPN INJECT 2MG  SUBCUTANEOUSLY ONCE A WEEK   Semaglutide , 2 MG/DOSE, (OZEMPIC , 2 MG/DOSE,) 8 MG/3ML SOPN INJECT 2 MG AS DIRECTED ONCE A WEEK.   sertraline  (ZOLOFT ) 100 MG  tablet Take 1 tablet by mouth once daily   triamcinolone  cream (KENALOG ) 0.5 % APPLY CREAM TOPICALLY TO AFFECTED AREA TWICE DAILY, AS DIRECTED   valACYclovir  (VALTREX ) 1000 MG tablet 2 tablets onset of cold sore, take 2 more tablets twelve hours later.   Vitamin D , Ergocalciferol , (DRISDOL ) 1.25 MG (50000 UNIT) CAPS capsule Take 1 capsule (50,000 Units total) by mouth once a week.   zolpidem  (AMBIEN ) 10 MG tablet Take 1 tablet (10 mg total) by mouth at bedtime as needed. for sleep   No facility-administered medications prior to visit.      Objective    BP 123/77 (BP Location: Left Arm, Patient Position: Sitting, Cuff Size: Large)   Pulse 78   Resp 16   Ht 5\' 3"  (1.6 m)   Wt (!)  322 lb 3.2 oz (146.1 kg)   SpO2 98%   BMI 57.08 kg/m  {Insert last BP/Wt (optional):23777}{See vitals history (optional):1}  Physical Exam   General: Appearance:    Obese female in no acute distress  Eyes:    PERRL, conjunctiva/corneas clear, EOM's intact       Lungs:     Faint scattered wheezes. No rales, respirations unlabored  Heart:    Normal heart rate. Normal rhythm. No murmurs, rubs, or gallops.    MS:   All extremities are intact.    Neurologic:   Awake, alert, oriented x 3. No apparent focal neurological defect.         Assessment & Plan     1. Diabetes mellitus with coincident hypertension (HCC) (Primary) Now getting Ozempic  2mg  consistently every week and tolerating well.  - Hemoglobin A1c - Urine Albumin/Creatinine with ratio (send out) [[id number]]  2. Essential hypertension Well controlled.  Continue current medications.    3. Fatty liver  - Comprehensive metabolic panel with GFR - CBC  4. Hypothyroidism, unspecified type  - TSH + free T4  5. Hyperlipidemia, unspecified hyperlipidemia type Statin intolerant. Now getting Repatha  consistently every 2 weeks.  - Lipid panel  6. Vitamin D  deficiency  - Vitamin D  (25 hydroxy)  7. Morbid obesity (HCC) She feels her appetite has been signifciantly less since getting the Ozempic  consistently.   8. Polymyalgia rheumatica (HCC) Tolerable on 10mg  daily prednisone , intolerable on lower doses.   9. Wheezing She reports onset just during allergy season. Can take OTC non-sedating antihistamine. Prescription albuterol (VENTOLIN HFA) 108 (90 Base) MCG/ACT inhaler; Inhale 2 puffs into the lungs every 6 (six) hours as needed for wheezing or shortness of breath.  Dispense: 8 g; Refill: 2  10. Depression, unspecified depression type Continue current dose sertraline .   11. Primary insomnia Occasional use of zolpidem  (AMBIEN ) 10 MG tablet; Take 1 tablet (10 mg total) by mouth at bedtime as needed. for sleep  Dispense: 30  tablet; Refill: 2         Jeralene Mom, MD  [facility] [phone] (phone) [phone] (fax)  [facility]

## 2023-10-15 NOTE — Telephone Encounter (Signed)
 Prescription sent in today during office visit

## 2023-10-16 ENCOUNTER — Other Ambulatory Visit: Payer: Self-pay | Admitting: Family Medicine

## 2023-10-16 DIAGNOSIS — R748 Abnormal levels of other serum enzymes: Secondary | ICD-10-CM

## 2023-10-16 DIAGNOSIS — M791 Myalgia, unspecified site: Secondary | ICD-10-CM

## 2023-10-16 DIAGNOSIS — R700 Elevated erythrocyte sedimentation rate: Secondary | ICD-10-CM

## 2023-10-17 LAB — HEMOGLOBIN A1C
Est. average glucose Bld gHb Est-mCnc: 126 mg/dL
Hgb A1c MFr Bld: 6 % — ABNORMAL HIGH (ref 4.8–5.6)

## 2023-10-17 LAB — CBC
Hematocrit: 40.5 % (ref 34.0–46.6)
Hemoglobin: 13.2 g/dL (ref 11.1–15.9)
MCH: 26.8 pg (ref 26.6–33.0)
MCHC: 32.6 g/dL (ref 31.5–35.7)
MCV: 82 fL (ref 79–97)
Platelets: 257 10*3/uL (ref 150–450)
RBC: 4.93 x10E6/uL (ref 3.77–5.28)
RDW: 14.3 % (ref 11.7–15.4)
WBC: 7.3 10*3/uL (ref 3.4–10.8)

## 2023-10-17 LAB — COMPREHENSIVE METABOLIC PANEL WITH GFR
ALT: 26 IU/L (ref 0–32)
AST: 20 IU/L (ref 0–40)
Albumin: 4 g/dL (ref 3.8–4.9)
Alkaline Phosphatase: 87 IU/L (ref 44–121)
BUN/Creatinine Ratio: 22 (ref 9–23)
BUN: 14 mg/dL (ref 6–24)
Bilirubin Total: 0.4 mg/dL (ref 0.0–1.2)
CO2: 26 mmol/L (ref 20–29)
Calcium: 9.5 mg/dL (ref 8.7–10.2)
Chloride: 103 mmol/L (ref 96–106)
Creatinine, Ser: 0.65 mg/dL (ref 0.57–1.00)
Globulin, Total: 2.9 g/dL (ref 1.5–4.5)
Glucose: 92 mg/dL (ref 70–99)
Potassium: 3.9 mmol/L (ref 3.5–5.2)
Sodium: 142 mmol/L (ref 134–144)
Total Protein: 6.9 g/dL (ref 6.0–8.5)
eGFR: 105 mL/min/{1.73_m2} (ref >59)

## 2023-10-17 LAB — LIPID PANEL
Chol/HDL Ratio: 2.9 ratio (ref 0.0–4.4)
Cholesterol, Total: 162 mg/dL (ref 100–199)
HDL: 56 mg/dL (ref >39)
LDL Chol Calc (NIH): 79 mg/dL (ref 0–99)
Triglycerides: 155 mg/dL — ABNORMAL HIGH (ref 0–149)
VLDL Cholesterol Cal: 27 mg/dL (ref 5–40)

## 2023-10-17 LAB — TSH+FREE T4
Free T4: 1.38 ng/dL (ref 0.82–1.77)
TSH: 0.172 u[IU]/mL — ABNORMAL LOW (ref 0.450–4.500)

## 2023-10-17 LAB — VITAMIN D 25 HYDROXY (VIT D DEFICIENCY, FRACTURES): Vit D, 25-Hydroxy: 49.4 ng/mL (ref 30.0–100.0)

## 2023-10-17 LAB — MICROALBUMIN / CREATININE URINE RATIO
Creatinine, Urine: 259.8 mg/dL
Microalb/Creat Ratio: 5 mg/g{creat} (ref 0–29)
Microalbumin, Urine: 14 ug/mL

## 2023-10-18 ENCOUNTER — Encounter: Payer: Self-pay | Admitting: Family Medicine

## 2023-10-18 DIAGNOSIS — M791 Myalgia, unspecified site: Secondary | ICD-10-CM

## 2023-10-18 DIAGNOSIS — R748 Abnormal levels of other serum enzymes: Secondary | ICD-10-CM

## 2023-10-18 DIAGNOSIS — R700 Elevated erythrocyte sedimentation rate: Secondary | ICD-10-CM

## 2023-10-19 ENCOUNTER — Encounter: Payer: Self-pay | Admitting: Family Medicine

## 2023-10-19 MED ORDER — PREDNISONE 10 MG PO TABS: 10.0000 mg | ORAL_TABLET | Freq: Every day | ORAL | 5 refills | Status: DC

## 2023-10-19 NOTE — Telephone Encounter (Signed)
 Received a fax from [facility] stating the following:  Alternative requested:  Ins pays 0.5tab/day; Needs PA for 1 tab.  Suggested alternatives:  zaleplon 10mg  capsule, Zaleplon 10mg  Capsule, Eszopiclone 3mg  tablet

## 2023-11-02 ENCOUNTER — Encounter: Payer: Self-pay | Admitting: Family Medicine

## 2023-11-05 ENCOUNTER — Encounter: Payer: Self-pay | Admitting: Family Medicine

## 2023-11-05 DIAGNOSIS — R748 Abnormal levels of other serum enzymes: Secondary | ICD-10-CM

## 2023-11-05 DIAGNOSIS — M791 Myalgia, unspecified site: Secondary | ICD-10-CM

## 2023-11-05 DIAGNOSIS — R700 Elevated erythrocyte sedimentation rate: Secondary | ICD-10-CM

## 2023-11-08 ENCOUNTER — Encounter: Payer: Self-pay | Admitting: Family Medicine

## 2023-11-09 ENCOUNTER — Other Ambulatory Visit: Payer: Self-pay

## 2023-11-09 DIAGNOSIS — N946 Dysmenorrhea, unspecified: Secondary | ICD-10-CM

## 2023-11-09 MED ORDER — ALBUTEROL SULFATE (2.5 MG/3ML) 0.083% IN NEBU: 2.5000 mg | INHALATION_SOLUTION | Freq: Four times a day (QID) | RESPIRATORY_TRACT | 1 refills | Status: AC | PRN

## 2023-11-09 NOTE — Telephone Encounter (Signed)
 Rx has been sent to the provider to be completed

## 2023-11-09 NOTE — Telephone Encounter (Signed)
 LOV 05*02*25 NOV none LRF [id number] LABS 5*2*25

## 2023-11-10 MED ORDER — HYDROCODONE-ACETAMINOPHEN 5-325 MG PO TABS: 1.0000 | ORAL_TABLET | Freq: Four times a day (QID) | ORAL | 0 refills | Status: DC | PRN

## 2023-11-11 MED ORDER — PREDNISONE 10 MG PO TABS
10.0000 mg | ORAL_TABLET | Freq: Every day | ORAL | 5 refills | Status: AC
Start: 2023-11-11 — End: ?

## 2023-12-04 ENCOUNTER — Other Ambulatory Visit: Payer: Self-pay | Admitting: Family Medicine

## 2023-12-04 DIAGNOSIS — M62838 Other muscle spasm: Secondary | ICD-10-CM

## 2023-12-04 DIAGNOSIS — M353 Polymyalgia rheumatica: Secondary | ICD-10-CM

## 2023-12-06 ENCOUNTER — Other Ambulatory Visit: Payer: Self-pay

## 2023-12-06 ENCOUNTER — Other Ambulatory Visit: Payer: Self-pay | Admitting: Family Medicine

## 2023-12-06 DIAGNOSIS — M791 Myalgia, unspecified site: Secondary | ICD-10-CM

## 2023-12-06 DIAGNOSIS — R700 Elevated erythrocyte sedimentation rate: Secondary | ICD-10-CM

## 2023-12-06 DIAGNOSIS — R748 Abnormal levels of other serum enzymes: Secondary | ICD-10-CM

## 2023-12-06 NOTE — Telephone Encounter (Signed)
 Do not take together

## 2023-12-14 ENCOUNTER — Other Ambulatory Visit: Payer: Self-pay

## 2023-12-14 DIAGNOSIS — N946 Dysmenorrhea, unspecified: Secondary | ICD-10-CM

## 2023-12-14 MED ORDER — HYDROCODONE-ACETAMINOPHEN 5-325 MG PO TABS: 1.0000 | ORAL_TABLET | Freq: Four times a day (QID) | ORAL | 0 refills | Status: DC | PRN

## 2023-12-29 ENCOUNTER — Other Ambulatory Visit (HOSPITAL_COMMUNITY): Payer: Self-pay

## 2024-01-06 ENCOUNTER — Other Ambulatory Visit: Payer: Self-pay | Admitting: Family Medicine

## 2024-01-06 DIAGNOSIS — M353 Polymyalgia rheumatica: Secondary | ICD-10-CM

## 2024-01-06 DIAGNOSIS — M62838 Other muscle spasm: Secondary | ICD-10-CM

## 2024-01-13 ENCOUNTER — Other Ambulatory Visit: Payer: Self-pay

## 2024-01-13 ENCOUNTER — Encounter: Payer: Self-pay | Admitting: Family Medicine

## 2024-01-13 DIAGNOSIS — N946 Dysmenorrhea, unspecified: Secondary | ICD-10-CM

## 2024-01-13 NOTE — Telephone Encounter (Signed)
 LOV 10/15/23 NOV none LRF 12/14/23 30 x 0

## 2024-01-14 MED ORDER — HYDROCODONE-ACETAMINOPHEN 5-325 MG PO TABS
1.0000 | ORAL_TABLET | Freq: Four times a day (QID) | ORAL | 0 refills | Status: DC | PRN
Start: 2024-01-14 — End: 2024-02-15

## 2024-01-23 ENCOUNTER — Other Ambulatory Visit: Payer: Self-pay | Admitting: Family Medicine

## 2024-01-23 DIAGNOSIS — E119 Type 2 diabetes mellitus without complications: Secondary | ICD-10-CM

## 2024-02-15 ENCOUNTER — Other Ambulatory Visit: Payer: Self-pay

## 2024-02-15 ENCOUNTER — Encounter: Payer: Self-pay | Admitting: Family Medicine

## 2024-02-15 DIAGNOSIS — N946 Dysmenorrhea, unspecified: Secondary | ICD-10-CM

## 2024-02-15 MED ORDER — HYDROCODONE-ACETAMINOPHEN 5-325 MG PO TABS: 1.0000 | ORAL_TABLET | Freq: Four times a day (QID) | ORAL | 0 refills | Status: DC | PRN

## 2024-02-24 ENCOUNTER — Other Ambulatory Visit: Payer: Self-pay | Admitting: Family Medicine

## 2024-02-24 DIAGNOSIS — R062 Wheezing: Secondary | ICD-10-CM

## 2024-03-05 ENCOUNTER — Other Ambulatory Visit: Payer: Self-pay | Admitting: Family Medicine

## 2024-03-05 DIAGNOSIS — F32A Depression, unspecified: Secondary | ICD-10-CM

## 2024-03-05 DIAGNOSIS — E039 Hypothyroidism, unspecified: Secondary | ICD-10-CM

## 2024-03-05 DIAGNOSIS — I10 Essential (primary) hypertension: Secondary | ICD-10-CM

## 2024-03-17 ENCOUNTER — Encounter: Payer: Self-pay | Admitting: Family Medicine

## 2024-03-17 DIAGNOSIS — N946 Dysmenorrhea, unspecified: Secondary | ICD-10-CM

## 2024-03-17 MED ORDER — HYDROCODONE-ACETAMINOPHEN 5-325 MG PO TABS
1.0000 | ORAL_TABLET | Freq: Four times a day (QID) | ORAL | 0 refills | Status: DC | PRN
Start: 2024-03-17 — End: 2024-04-17

## 2024-03-25 ENCOUNTER — Other Ambulatory Visit: Payer: Self-pay | Admitting: Family Medicine

## 2024-03-25 DIAGNOSIS — M353 Polymyalgia rheumatica: Secondary | ICD-10-CM

## 2024-03-25 DIAGNOSIS — M62838 Other muscle spasm: Secondary | ICD-10-CM

## 2024-04-16 ENCOUNTER — Encounter: Payer: Self-pay | Admitting: Family Medicine

## 2024-04-17 ENCOUNTER — Other Ambulatory Visit: Payer: Self-pay

## 2024-04-17 DIAGNOSIS — N946 Dysmenorrhea, unspecified: Secondary | ICD-10-CM

## 2024-04-17 MED ORDER — HYDROCODONE-ACETAMINOPHEN 5-325 MG PO TABS: 1.0000 | ORAL_TABLET | Freq: Four times a day (QID) | ORAL | 0 refills | Status: DC | PRN

## 2024-05-11 ENCOUNTER — Other Ambulatory Visit: Payer: Self-pay | Admitting: Family Medicine

## 2024-05-11 DIAGNOSIS — R062 Wheezing: Secondary | ICD-10-CM

## 2024-05-13 ENCOUNTER — Other Ambulatory Visit: Payer: Self-pay | Admitting: Family Medicine

## 2024-05-13 DIAGNOSIS — E119 Type 2 diabetes mellitus without complications: Secondary | ICD-10-CM

## 2024-05-16 ENCOUNTER — Other Ambulatory Visit: Payer: Self-pay

## 2024-05-16 ENCOUNTER — Encounter: Payer: Self-pay | Admitting: Family Medicine

## 2024-05-16 DIAGNOSIS — N946 Dysmenorrhea, unspecified: Secondary | ICD-10-CM

## 2024-05-17 MED ORDER — HYDROCODONE-ACETAMINOPHEN 5-325 MG PO TABS: 1.0000 | ORAL_TABLET | Freq: Four times a day (QID) | ORAL | 0 refills | Status: DC | PRN

## 2024-05-18 ENCOUNTER — Other Ambulatory Visit: Payer: Self-pay | Admitting: Family Medicine

## 2024-05-18 DIAGNOSIS — E559 Vitamin D deficiency, unspecified: Secondary | ICD-10-CM

## 2024-06-03 ENCOUNTER — Other Ambulatory Visit: Payer: Self-pay | Admitting: Family Medicine

## 2024-06-03 DIAGNOSIS — F32A Depression, unspecified: Secondary | ICD-10-CM

## 2024-06-07 ENCOUNTER — Other Ambulatory Visit: Payer: Self-pay | Admitting: Family Medicine

## 2024-06-10 ENCOUNTER — Other Ambulatory Visit: Payer: Self-pay | Admitting: Family Medicine

## 2024-06-10 DIAGNOSIS — E119 Type 2 diabetes mellitus without complications: Secondary | ICD-10-CM

## 2024-06-11 ENCOUNTER — Other Ambulatory Visit: Payer: Self-pay | Admitting: Family Medicine

## 2024-06-17 ENCOUNTER — Encounter: Payer: Self-pay | Admitting: Family Medicine

## 2024-06-19 NOTE — Telephone Encounter (Signed)
 She is overdue for office visit and needs to schedule before any refills can be approved.

## 2024-06-26 ENCOUNTER — Other Ambulatory Visit: Payer: Self-pay | Admitting: Family Medicine

## 2024-06-26 DIAGNOSIS — F32A Depression, unspecified: Secondary | ICD-10-CM

## 2024-07-02 ENCOUNTER — Other Ambulatory Visit: Payer: Self-pay | Admitting: Family Medicine

## 2024-07-05 ENCOUNTER — Other Ambulatory Visit (HOSPITAL_COMMUNITY): Payer: Self-pay

## 2024-07-05 ENCOUNTER — Telehealth: Payer: Self-pay | Admitting: Pharmacy Technician

## 2024-07-05 NOTE — Telephone Encounter (Signed)
 Pharmacy Patient Advocate Encounter   Received notification from CoverMyMeds/ fax that prior authorization for Repatha  is required/requested.   Insurance verification completed.   The patient is insured through [employer].   Per test claim: PA required; PA submitted to above mentioned insurance via Latent Key/confirmation #/EOC [id number] Status is pending

## 2024-07-06 ENCOUNTER — Other Ambulatory Visit: Payer: Self-pay | Admitting: Family Medicine

## 2024-07-06 DIAGNOSIS — E119 Type 2 diabetes mellitus without complications: Secondary | ICD-10-CM

## 2024-07-06 NOTE — Telephone Encounter (Signed)
 Pharmacy Patient Advocate Encounter  Received notification from [employer] that Prior Authorization for repatha  has been APPROVED from 07/06/24 to 07/05/25   PA #/Case ID/Reference #: [id number]

## 2024-07-14 ENCOUNTER — Encounter: Payer: Self-pay | Admitting: Family Medicine

## 2024-07-14 ENCOUNTER — Ambulatory Visit: Admitting: Family Medicine

## 2024-07-14 VITALS — BP 107/62 | Resp 16 | Ht 63.0 in | Wt 320.0 lb

## 2024-07-14 DIAGNOSIS — E119 Type 2 diabetes mellitus without complications: Secondary | ICD-10-CM | POA: Diagnosis not present

## 2024-07-14 DIAGNOSIS — G720 Drug-induced myopathy: Secondary | ICD-10-CM

## 2024-07-14 DIAGNOSIS — T466X5A Adverse effect of antihyperlipidemic and antiarteriosclerotic drugs, initial encounter: Secondary | ICD-10-CM

## 2024-07-14 DIAGNOSIS — Z1159 Encounter for screening for other viral diseases: Secondary | ICD-10-CM

## 2024-07-14 DIAGNOSIS — Z23 Encounter for immunization: Secondary | ICD-10-CM

## 2024-07-14 DIAGNOSIS — Z124 Encounter for screening for malignant neoplasm of cervix: Secondary | ICD-10-CM

## 2024-07-14 DIAGNOSIS — E039 Hypothyroidism, unspecified: Secondary | ICD-10-CM

## 2024-07-14 DIAGNOSIS — E785 Hyperlipidemia, unspecified: Secondary | ICD-10-CM | POA: Diagnosis not present

## 2024-07-14 DIAGNOSIS — M353 Polymyalgia rheumatica: Secondary | ICD-10-CM | POA: Diagnosis not present

## 2024-07-14 DIAGNOSIS — I10 Essential (primary) hypertension: Secondary | ICD-10-CM

## 2024-07-14 DIAGNOSIS — E559 Vitamin D deficiency, unspecified: Secondary | ICD-10-CM

## 2024-07-14 DIAGNOSIS — T466X5S Adverse effect of antihyperlipidemic and antiarteriosclerotic drugs, sequela: Secondary | ICD-10-CM | POA: Diagnosis not present

## 2024-07-14 DIAGNOSIS — Z7985 Long-term (current) use of injectable non-insulin antidiabetic drugs: Secondary | ICD-10-CM

## 2024-07-14 DIAGNOSIS — Z1231 Encounter for screening mammogram for malignant neoplasm of breast: Secondary | ICD-10-CM

## 2024-07-14 LAB — POCT GLYCOSYLATED HEMOGLOBIN (HGB A1C): Hemoglobin A1C: 5.9 % — AB (ref 4.0–5.6)

## 2024-07-14 MED ORDER — HYDROCODONE-ACETAMINOPHEN 5-325 MG PO TABS: 1.0000 | ORAL_TABLET | Freq: Four times a day (QID) | ORAL | 0 refills | Status: AC | PRN

## 2024-07-14 NOTE — Patient Instructions (Addendum)
 I recommend that you get the Prevnar 20 vaccine to protect yourself from certain dangerous strains of pneumonia. You can get Prevnar 20 at your pharmacy, or call our office at [phone] at your earliest convenience to schedule this vaccine.   I strongly recommend two doses of Shingrix (the shingles vaccine) separated by 2 to 6 months for adults age 54 years and older. This vaccine is usually covered by your part D pharmacy benefit, so you should be able to get at your local pharmacy.

## 2024-07-15 LAB — COMPREHENSIVE METABOLIC PANEL WITH GFR
ALT: 22 [IU]/L (ref 0–32)
AST: 21 [IU]/L (ref 0–40)
Albumin: 3.8 g/dL (ref 3.8–4.9)
Alkaline Phosphatase: 89 [IU]/L (ref 49–135)
BUN/Creatinine Ratio: 20 (ref 9–23)
BUN: 14 mg/dL (ref 6–24)
Bilirubin Total: 0.5 mg/dL (ref 0.0–1.2)
CO2: 22 mmol/L (ref 20–29)
Calcium: 9.4 mg/dL (ref 8.7–10.2)
Chloride: 105 mmol/L (ref 96–106)
Creatinine, Ser: 0.7 mg/dL (ref 0.57–1.00)
Globulin, Total: 3 g/dL (ref 1.5–4.5)
Glucose: 89 mg/dL (ref 70–99)
Potassium: 4 mmol/L (ref 3.5–5.2)
Sodium: 144 mmol/L (ref 134–144)
Total Protein: 6.8 g/dL (ref 6.0–8.5)
eGFR: 103 mL/min/{1.73_m2}

## 2024-07-15 LAB — LIPID PANEL
Chol/HDL Ratio: 2.9 ratio (ref 0.0–4.4)
Cholesterol, Total: 170 mg/dL (ref 100–199)
HDL: 58 mg/dL
LDL Chol Calc (NIH): 92 mg/dL (ref 0–99)
Triglycerides: 115 mg/dL (ref 0–149)
VLDL Cholesterol Cal: 20 mg/dL (ref 5–40)

## 2024-07-15 LAB — CBC
Hematocrit: 39.5 % (ref 34.0–46.6)
Hemoglobin: 12.9 g/dL (ref 11.1–15.9)
MCH: 26.5 pg — ABNORMAL LOW (ref 26.6–33.0)
MCHC: 32.7 g/dL (ref 31.5–35.7)
MCV: 81 fL (ref 79–97)
Platelets: 263 10*3/uL (ref 150–450)
RBC: 4.87 x10E6/uL (ref 3.77–5.28)
RDW: 14.4 % (ref 11.7–15.4)
WBC: 7.8 10*3/uL (ref 3.4–10.8)

## 2024-07-15 LAB — TSH+FREE T4
Free T4: 1.73 ng/dL (ref 0.82–1.77)
TSH: 0.307 u[IU]/mL — ABNORMAL LOW (ref 0.450–4.500)

## 2024-07-15 LAB — VITAMIN D 25 HYDROXY (VIT D DEFICIENCY, FRACTURES): Vit D, 25-Hydroxy: 49 ng/mL (ref 30.0–100.0)

## 2024-07-16 ENCOUNTER — Ambulatory Visit: Payer: Self-pay | Admitting: Family Medicine

## 2024-07-21 ENCOUNTER — Ambulatory Visit: Admitting: Family Medicine

## 2024-07-21 DIAGNOSIS — M353 Polymyalgia rheumatica: Secondary | ICD-10-CM

## 2024-07-21 DIAGNOSIS — E785 Hyperlipidemia, unspecified: Secondary | ICD-10-CM

## 2024-07-21 DIAGNOSIS — K760 Fatty (change of) liver, not elsewhere classified: Secondary | ICD-10-CM

## 2024-07-21 DIAGNOSIS — Z23 Encounter for immunization: Secondary | ICD-10-CM

## 2024-07-21 DIAGNOSIS — E282 Polycystic ovarian syndrome: Secondary | ICD-10-CM

## 2024-07-21 DIAGNOSIS — G720 Drug-induced myopathy: Secondary | ICD-10-CM

## 2024-07-21 DIAGNOSIS — E119 Type 2 diabetes mellitus without complications: Secondary | ICD-10-CM

## 2024-07-21 DIAGNOSIS — E559 Vitamin D deficiency, unspecified: Secondary | ICD-10-CM

## 2024-07-21 DIAGNOSIS — I10 Essential (primary) hypertension: Secondary | ICD-10-CM

## 2024-07-21 DIAGNOSIS — E039 Hypothyroidism, unspecified: Secondary | ICD-10-CM

## 2024-07-21 NOTE — Progress Notes (Signed)
 No E&M visit. Vaccines only.

## 2025-01-12 ENCOUNTER — Ambulatory Visit: Admitting: Family Medicine
